# Patient Record
Sex: Female | Born: 1955 | Race: White | Hispanic: No | Smoking: Current every day smoker
Health system: Southern US, Community
[De-identification: ages and names within clinical notes are randomized; demographics above are authoritative.]

## PROBLEM LIST (undated history)

## (undated) DIAGNOSIS — C50919 Malignant neoplasm of unspecified site of unspecified female breast: Secondary | ICD-10-CM

## (undated) DIAGNOSIS — F419 Anxiety disorder, unspecified: Secondary | ICD-10-CM

## (undated) DIAGNOSIS — M779 Enthesopathy, unspecified: Secondary | ICD-10-CM

## (undated) HISTORY — DX: Anxiety disorder, unspecified: F41.9

## (undated) HISTORY — DX: Malignant neoplasm of unspecified site of unspecified female breast: C50.919

## (undated) HISTORY — PX: MASTECTOMY PARTIAL / LUMPECTOMY W/ AXILLARY LYMPHADENECTOMY: SUR852

## (undated) HISTORY — DX: Enthesopathy, unspecified: M77.9

## (undated) HISTORY — PX: MASTECTOMY, RADICAL: SHX710

---

## 1999-05-10 DIAGNOSIS — C50919 Malignant neoplasm of unspecified site of unspecified female breast: Secondary | ICD-10-CM

## 1999-05-10 HISTORY — PX: MASTECTOMY PARTIAL / LUMPECTOMY W/ AXILLARY LYMPHADENECTOMY: SUR852

## 1999-05-10 HISTORY — DX: Malignant neoplasm of unspecified site of unspecified female breast: C50.919

## 2000-06-30 ENCOUNTER — Encounter: Admission: RE | Admit: 2000-06-30 | Discharge: 2000-09-28 | Payer: Self-pay | Admitting: Radiation Oncology

## 2000-07-03 ENCOUNTER — Encounter: Admission: RE | Admit: 2000-07-03 | Discharge: 2000-10-01 | Payer: Self-pay | Admitting: Radiation Oncology

## 2004-05-09 HISTORY — PX: BREAST RECONSTRUCTION: SHX9

## 2004-05-09 HISTORY — PX: MASTECTOMY, RADICAL: SHX710

## 2006-11-03 ENCOUNTER — Emergency Department (HOSPITAL_COMMUNITY)
Admission: EM | Admit: 2006-11-03 | Discharge: 2006-11-03 | Payer: Self-pay | Admitting: Anatomic Pathology & Clinical Pathology

## 2009-05-09 HISTORY — PX: BREAST LUMPECTOMY: SHX2

## 2010-05-30 ENCOUNTER — Encounter: Payer: Self-pay | Admitting: General Surgery

## 2011-08-19 ENCOUNTER — Telehealth (HOSPITAL_COMMUNITY): Payer: Self-pay | Admitting: Oncology

## 2011-09-01 ENCOUNTER — Encounter (HOSPITAL_COMMUNITY): Payer: Self-pay

## 2011-09-01 ENCOUNTER — Encounter (HOSPITAL_COMMUNITY): Payer: BC Managed Care – HMO | Attending: Hematology and Oncology

## 2011-09-01 ENCOUNTER — Encounter (HOSPITAL_COMMUNITY): Payer: BC Managed Care – HMO

## 2011-09-01 VITALS — BP 150/87 | HR 82 | Temp 97.7°F | Ht 67.5 in | Wt 153.0 lb

## 2011-09-01 DIAGNOSIS — R911 Solitary pulmonary nodule: Secondary | ICD-10-CM

## 2011-09-01 DIAGNOSIS — F17200 Nicotine dependence, unspecified, uncomplicated: Secondary | ICD-10-CM | POA: Insufficient documentation

## 2011-09-01 DIAGNOSIS — C44509 Unspecified malignant neoplasm of skin of other part of trunk: Secondary | ICD-10-CM

## 2011-09-01 DIAGNOSIS — C50919 Malignant neoplasm of unspecified site of unspecified female breast: Secondary | ICD-10-CM | POA: Insufficient documentation

## 2011-09-01 DIAGNOSIS — M810 Age-related osteoporosis without current pathological fracture: Secondary | ICD-10-CM

## 2011-09-01 DIAGNOSIS — C44599 Other specified malignant neoplasm of skin of other part of trunk: Secondary | ICD-10-CM

## 2011-09-01 DIAGNOSIS — Z853 Personal history of malignant neoplasm of breast: Secondary | ICD-10-CM

## 2011-09-01 LAB — COMPREHENSIVE METABOLIC PANEL
ALT: 14 U/L (ref 0–35)
AST: 22 U/L (ref 0–37)
Albumin: 4.3 g/dL (ref 3.5–5.2)
CO2: 25 mEq/L (ref 19–32)
Calcium: 10.2 mg/dL (ref 8.4–10.5)
GFR calc non Af Amer: >90 mL/min (ref >90)
Sodium: 134 mEq/L — ABNORMAL LOW (ref 135–145)
Total Protein: 7.5 g/dL (ref 6.0–8.3)

## 2011-09-01 LAB — CBC
MCH: 30 pg (ref 26.0–34.0)
MCHC: 33.3 g/dL (ref 30.0–36.0)
Platelets: 281 10*3/uL (ref 150–400)
RBC: 4.57 MIL/uL (ref 3.87–5.11)

## 2011-09-01 LAB — DIFFERENTIAL
Basophils Relative: 0 % (ref 0–1)
Eosinophils Absolute: 0.4 10*3/uL (ref 0.0–0.7)
Lymphs Abs: 1.8 10*3/uL (ref 0.7–4.0)
Neutro Abs: 5.6 10*3/uL (ref 1.7–7.7)
Neutrophils Relative %: 65 % (ref 43–77)

## 2011-09-01 NOTE — Patient Instructions (Signed)
Patient to follow up as instructed.   Current Outpatient Prescriptions  Medication Sig Dispense Refill  . anastrozole (ARIMIDEX) 1 MG tablet Take 1 mg by mouth daily.      . clonazePAM (KLONOPIN) 0.5 MG tablet Take 0.5 mg by mouth 3 (three) times daily as needed.      . fish oil-omega-3 fatty acids 1000 MG capsule Take 1 g by mouth daily.      . Multiple Vitamins-Minerals (MULTIVITAMIN WITH MINERALS) tablet Take 1 tablet by mouth daily.      . ranitidine (ZANTAC) 150 MG tablet Take 150 mg by mouth 2 (two) times daily.      . sertraline (ZOLOFT) 100 MG tablet Take 100 mg by mouth daily.      . vitamin B-12 (CYANOCOBALAMIN) 100 MCG tablet Take 100 mcg by mouth daily.      . vitamin C (ASCORBIC ACID) 500 MG tablet Take 500 mg by mouth daily.            April 2013  Sunday Monday Tuesday Wednesday Thursday Friday Saturday      1   2   3   4   5   6    7   8   9   10   11   12   13    14   15   16   17   18   19   20    21   22   23   24   25    NEW PATIENT 60  11:00 AM  (60 min.)  [facility] Covering Provider  [facility] 26   27    28   29    30

## 2011-09-01 NOTE — Progress Notes (Signed)
CC:   Krista Lora, MD Krista Neas, DO Krista [place], M.D.  IDENTIFYING STATEMENT:  The patient is a 56 year old woman seen at the request of Dr. Nancie [place] with breast cancer.  HISTORY OF PRESENT ILLNESS:  The patient was referred to [facility] to establish continuity of care.  She has a history of recurrent breast cancer and is currently on hormonal therapy in the form of Arimidex which she began on 02/26/2010.  The patient's history is as follows:  She was initially diagnosed following a screening mammogram in [facility] in [place] 2001 with a left breast mass.  Subsequent workup and biopsy diagnosed invasive adenocarcinoma.  On May 11, 2000, she underwent a partial left lumpectomy and the tumor was 0.88 cm in size, ER positive 80%, PR positive 10%, and HER2/neu negative with a pathology stage pT1b N0 MX.  The patient then completed radiation therapy in [place] under the care of Dr. Emilie Rutter.  She was apparently offered chemotherapy and hormonal therapy which she declined.  In July 2006, she had a recurrence in the left breast at the surgical site.  She underwent bilateral mastectomies for an 8 mm invasive adenocarcinoma, ER 95%, PR 20%, and HER2/neu negative.  The pathological stage was a pt1b NX MX. The patient was offered and declined tamoxifen.  She underwent a TRAM reconstruction in July 2007.  She continued surveillance and did well until December 28, 2009.  She noted nodularity on the chest wall which was thought initially to be a fat necrosis but subsequent workup that included a biopsy showed an invasive ductal adenocarcinoma.  As a result of positive margins, she underwent a re-excision on 02/01/2010.  The patient agreed to hormonal therapy and began Arimidex on 02/27/2011.  Of some note, a CT scan had noted small pulmonary nodules which have been followed with annual CT scans.  Her last CT scan on 03/04/2011 showed no definite CT evidence  of metastatic disease to the chest. There was an unchanged 5 mm nodule and other 3 mm subpleural nodule densities that remained stable.  In addition, she had received a DEXA scan a year ago for follow up of osteoporosis.  She takes Tums and OTC vitamin D.  She states she has not lost any weight.  She denies chest pain, cough, shortness of breath.  She is tolerating Arimidex with very minimal difficulty without nausea, vomiting, or diarrhea.  PAST MEDICAL HISTORY:  Recurrent breast cancer.  MEDICATIONS:  Arimidex 1 mg daily, Klonopin 0.5 mg 3 times daily as needed, fish oil1000 mg 1 g daily, multivitamin 1 tablet daily, Zantac 150 mg 2 tablets daily, Zoloft 100 mg daily, vitamin B12 100 mcg daily, vitamin C 500 mg daily.  ALLERGIES:  Codeine and latex.  SOCIAL HISTORY:  The patient is married with 2 stepchildren.  Smokes 1 pack a day for 30 years.  Drinks light beer a day.  She is a Futures trader. Lives in the [place] area.  FAMILY HISTORY:  The patient's mother had breast cancer and father had lung cancer.  REVIEW OF SYSTEMS:  Denies fever, chills, night sweats, anorexia, weight loss.  GI:  Denies nausea, vomiting, abdominal pain, diarrhea, melena, hematochezia.  GU:  Dysuria, hematuria, nocturia, frequency. Cardiovascular:  Denies chest pain, PND, orthopnea, ankle swelling. Skin:  No bruising or rash.  Rest of review of systems negative.  PHYSICAL EXAM:  General:  Patient is alert and oriented x3.  Vitals: Pulse 82, blood pressure 150/87, temperature 97.7, respirations 16, weight  153 pounds.  HEENT:  Head is atraumatic, normocephalic.  Sclerae anicteric.  Pupils equal, round, and reactive to light.  Mouth moist without ulcerations, thrush, or lesions.  Neck:  Supple.  Chest:  Clear to percussion and auscultation.  CVS:  First and second heart sounds present.  No added sounds or murmurs.  Abdomen:  Soft, nontender.  Bowel sounds present.  Extremities:  No calf tenderness or edema.   Lymph Nodes:  No adenopathy.  Breast Exam:  Bilateral breast implants with no masses or skin lesion.  No changes on chest wall or rash.  IMPRESSION AND PLAN:  [name] is a 56 year old woman with a history of recurrent left breast cancer status post lumpectomy initially followed by bilateral mastectomies and re-excision for recurrence.  She has received systemic chemotherapy and hormonal therapy in the past, but did agreed to Arimidex beginning 02/26/2010.  She is tolerating this well. Lung nodules had been noted on initial CT scans which remain unchanged and has been recommended to continue to monitor.  She is having this monitored annually which is reasonable. She continues to abuse tobacco and does not want to cease at this time. We offered smoking cessation.  She declined.  She has a history of osteoporosis and will need a bone density test at her next followup visit in 6 months' time with a CT scan.  She will continue Arimidex.  I spent more than half the time coordinating care.    ______________________________ Laurice Record, M.D. LIO/MEDQ  D:  09/01/2011  T:  09/01/2011  Job:  [id number]

## 2011-09-01 NOTE — Progress Notes (Signed)
This office note has been dictated.

## 2011-09-02 LAB — VITAMIN D 25 HYDROXY (VIT D DEFICIENCY, FRACTURES): Vit D, 25-Hydroxy: 65 ng/mL (ref 30–89)

## 2011-09-05 ENCOUNTER — Encounter (HOSPITAL_COMMUNITY): Payer: BC Managed Care – HMO

## 2011-09-12 NOTE — Progress Notes (Signed)
Wrong chart/wrong account number. This activity is VOID.

## 2011-09-25 NOTE — Progress Notes (Signed)
Note appreciated

## 2012-02-28 ENCOUNTER — Other Ambulatory Visit (HOSPITAL_COMMUNITY): Payer: BC Managed Care – HMO

## 2012-02-29 ENCOUNTER — Other Ambulatory Visit (HOSPITAL_COMMUNITY): Payer: Self-pay

## 2012-02-29 ENCOUNTER — Other Ambulatory Visit (HOSPITAL_COMMUNITY): Payer: BC Managed Care – HMO

## 2012-02-29 ENCOUNTER — Encounter (HOSPITAL_COMMUNITY): Payer: BC Managed Care – HMO | Attending: Oncology

## 2012-02-29 DIAGNOSIS — C44599 Other specified malignant neoplasm of skin of other part of trunk: Secondary | ICD-10-CM

## 2012-02-29 DIAGNOSIS — M810 Age-related osteoporosis without current pathological fracture: Secondary | ICD-10-CM | POA: Insufficient documentation

## 2012-02-29 DIAGNOSIS — C50919 Malignant neoplasm of unspecified site of unspecified female breast: Secondary | ICD-10-CM

## 2012-02-29 LAB — COMPREHENSIVE METABOLIC PANEL
ALT: 12 U/L (ref 0–35)
AST: 19 U/L (ref 0–37)
Calcium: 9.5 mg/dL (ref 8.4–10.5)
Potassium: 4.2 mEq/L (ref 3.5–5.1)
Sodium: 134 mEq/L — ABNORMAL LOW (ref 135–145)
Total Protein: 7.1 g/dL (ref 6.0–8.3)

## 2012-02-29 MED ORDER — ANASTROZOLE 1 MG PO TABS: 1.0000 mg | ORAL_TABLET | Freq: Every day | ORAL | Status: DC

## 2012-02-29 NOTE — Progress Notes (Signed)
Labs drawn today for [id number],cmp

## 2012-03-01 ENCOUNTER — Ambulatory Visit (HOSPITAL_COMMUNITY)
Admission: RE | Admit: 2012-03-01 | Discharge: 2012-03-01 | Disposition: A | Discharge status: Home / Self Care | Payer: BC Managed Care – PPO | Source: Ambulatory Visit | Attending: Hematology and Oncology | Admitting: Hematology and Oncology

## 2012-03-01 DIAGNOSIS — K429 Umbilical hernia without obstruction or gangrene: Secondary | ICD-10-CM | POA: Insufficient documentation

## 2012-03-01 DIAGNOSIS — K8020 Calculus of gallbladder without cholecystitis without obstruction: Secondary | ICD-10-CM | POA: Insufficient documentation

## 2012-03-01 DIAGNOSIS — Z9010 Acquired absence of unspecified breast and nipple: Secondary | ICD-10-CM | POA: Insufficient documentation

## 2012-03-01 DIAGNOSIS — Z853 Personal history of malignant neoplasm of breast: Secondary | ICD-10-CM | POA: Insufficient documentation

## 2012-03-01 DIAGNOSIS — R918 Other nonspecific abnormal finding of lung field: Secondary | ICD-10-CM | POA: Insufficient documentation

## 2012-03-01 DIAGNOSIS — C50919 Malignant neoplasm of unspecified site of unspecified female breast: Secondary | ICD-10-CM

## 2012-03-01 LAB — CANCER ANTIGEN 27.29: CA 27.29: 12 U/mL (ref 0–39)

## 2012-03-01 MED ORDER — IOHEXOL 300 MG/ML  SOLN
100.0000 mL | Freq: Once | INTRAMUSCULAR | Status: AC | PRN
  Administered 2012-03-01: 100 mL via INTRAVENOUS

## 2012-03-02 ENCOUNTER — Encounter (HOSPITAL_COMMUNITY): Payer: Self-pay | Admitting: Oncology

## 2012-03-02 ENCOUNTER — Other Ambulatory Visit (HOSPITAL_COMMUNITY): Payer: Self-pay

## 2012-03-02 ENCOUNTER — Other Ambulatory Visit (HOSPITAL_COMMUNITY): Payer: Self-pay | Admitting: Oncology

## 2012-03-05 ENCOUNTER — Ambulatory Visit (HOSPITAL_COMMUNITY): Payer: BC Managed Care – HMO | Admitting: Oncology

## 2012-03-05 ENCOUNTER — Telehealth (HOSPITAL_COMMUNITY): Payer: Self-pay | Admitting: *Deleted

## 2012-03-05 NOTE — Telephone Encounter (Signed)
Pt's husband called requesting info on ct scan that his wife had. Please advise

## 2012-03-06 ENCOUNTER — Ambulatory Visit (HOSPITAL_COMMUNITY): Payer: BC Managed Care – HMO | Admitting: Oncology

## 2012-03-09 ENCOUNTER — Other Ambulatory Visit (HOSPITAL_COMMUNITY): Payer: Self-pay | Admitting: *Deleted

## 2012-03-09 DIAGNOSIS — C50919 Malignant neoplasm of unspecified site of unspecified female breast: Secondary | ICD-10-CM

## 2012-03-09 DIAGNOSIS — R918 Other nonspecific abnormal finding of lung field: Secondary | ICD-10-CM

## 2012-03-12 ENCOUNTER — Encounter (HOSPITAL_COMMUNITY): Payer: BC Managed Care – HMO | Attending: Oncology | Admitting: Oncology

## 2012-03-12 VITALS — BP 140/85 | HR 78 | Temp 97.6°F | Resp 16 | Ht 67.0 in | Wt 151.2 lb

## 2012-03-12 DIAGNOSIS — K8020 Calculus of gallbladder without cholecystitis without obstruction: Secondary | ICD-10-CM

## 2012-03-12 DIAGNOSIS — M25519 Pain in unspecified shoulder: Secondary | ICD-10-CM

## 2012-03-12 DIAGNOSIS — C50919 Malignant neoplasm of unspecified site of unspecified female breast: Secondary | ICD-10-CM | POA: Insufficient documentation

## 2012-03-12 NOTE — Progress Notes (Signed)
Problem #1 recurrent cancer to left chest wall. She was initially diagnosed with a T1 B. breast cancer in 2001 status post lumpectomy followed by radiation therapy. She then had a recurrence to the left breast 2006 status post bilateral mastectomies at that time. She declined hormonal intervention at that juncture. She then had recurrent disease the left chest wall in 2011 status post biopsy proven disease followed by the use of anastrozole which she still takes today. She tolerates the anastrozole extremely well without obvious side effects. Her cancer was said to be estrogen receptor +80%, PR +10% at presentation with her recurrence in 2006 your receptors 95%, PR receptors 20%, HER-2/neu nonamplified. She underwent TRAM reconstruction in July 2007. I do not have receptor studies from the 2011 recurrence. She has never had tamoxifen or chemotherapy. Problem #2 ongoing smoking 28 pack years. Problem #3 possible dry eye syndrome Problem #4 anxiety and depression on Klonopin when necessary as well as Zoloft 100 mg a day Problem #5 single small gallstone asymptomatic  This 56 year old woman is here today for followup. She CAT scans the other day which do not reveal any evidence for metastatic disease to chest abdomen or pelvis. She has a single small gallstone. She occasionally shoulder discomfort and right upper quadrant discomfort but I'm not sure we can blame it on this very tiny gallstone. She may have some mild arthritis in her shoulders.  She is married for the second time. She was never pregnant and has no children whatsoever. She has 3 sisters who have no children. Her mother died at age 59 of metastatic recurrent breast cancer. She does not know of any other family history. She herself was never tested for the gene abnormality that she is aware of. She drinks beer on occasion. Oncologic review of systems is otherwise noncontributory. I believe she uses a vitamin a cream for some skin issues which I  have referred her to her dermatologist for a refill. Her vital signs are recorded and are unremarkable. She has no palpable lymphadenopathy in the cervical, supraclavicular, infraclavicular, axillary or inguinal areas. She does not have arm or leg edema. Abdomen is soft nontender without hepatosplenomegaly. Bowel sounds are diminished. She does state she has constipation at times. Heart exam shows a regular rhythm and rate without murmur rub or gallop. Lungs are clear to auscultation and percussion. Both chest walls are clear of lesions. She has reconstructed areas on both breasts areas. They are asymmetrical with the left more prominent than the right. She has tenderness over the left more so than the right. She is alert and oriented facial symmetry appears intact.  She may need a bone density in the next 12 months but she states she had one at [facility] within the last year and a half. We'll try to get those records.  She is to continue the anastrozole. We will see her in 12 weeks with blood work and a physical exam. We will then do CAT scans on her in 6 months.

## 2012-03-12 NOTE — Patient Instructions (Addendum)
[facility]  Discharge Instructions  RECOMMENDATIONS MADE BY THE CONSULTANT AND ANY TEST RESULTS WILL BE SENT TO YOUR REFERRING DOCTOR.   Continue taking the the Arimidex as prescribed.  We will have you come back in 12 weeks for lab work and then a few days later to see the doctor.  We will plan on repeating your scans again in 6 months.    I acknowledge that I have been informed and understand all the instructions given to me and received a copy. I do not have any more questions at this time, but understand that I may call the Specialty Clinic at [facility] at [phone] during business hours should I have any further questions or need assistance in obtaining follow-up care.    __________________________________________  _____________  __________ Signature of Patient or Authorized Representative            Date                   Time    __________________________________________ Nurse's Signature

## 2012-06-18 ENCOUNTER — Encounter (HOSPITAL_COMMUNITY): Payer: BC Managed Care – HMO | Attending: Oncology

## 2012-06-18 DIAGNOSIS — C50919 Malignant neoplasm of unspecified site of unspecified female breast: Secondary | ICD-10-CM

## 2012-06-18 LAB — CBC WITH DIFFERENTIAL/PLATELET
Basophils Relative: 1 % (ref 0–1)
Eosinophils Absolute: 0.2 10*3/uL (ref 0.0–0.7)
Eosinophils Relative: 3 % (ref 0–5)
MCH: 30.8 pg (ref 26.0–34.0)
MCHC: 34.7 g/dL (ref 30.0–36.0)
MCV: 88.8 fL (ref 78.0–100.0)
Neutrophils Relative %: 62 % (ref 43–77)
Platelets: 255 10*3/uL (ref 150–400)

## 2012-06-18 LAB — COMPREHENSIVE METABOLIC PANEL
ALT: 14 U/L (ref 0–35)
AST: 20 U/L (ref 0–37)
Albumin: 4.2 g/dL (ref 3.5–5.2)
Alkaline Phosphatase: 73 U/L (ref 39–117)
CO2: 26 mEq/L (ref 19–32)
Chloride: 99 mEq/L (ref 96–112)
GFR calc non Af Amer: 75 mL/min — ABNORMAL LOW (ref >90)
Potassium: 4.2 mEq/L (ref 3.5–5.1)
Sodium: 135 mEq/L (ref 135–145)
Total Bilirubin: 0.3 mg/dL (ref 0.3–1.2)

## 2012-06-18 LAB — CANCER ANTIGEN 27.29: CA 27.29: 16 U/mL (ref 0–39)

## 2012-06-18 NOTE — Progress Notes (Signed)
Krista Moore's reason for visit today are for labs as scheduled per MD orders.  Venipuncture performed with a 23 gauge butterfly needle to R Antecubital.  Krista [place] tolerated venipuncture well and without incident; questions were answered and patient was discharged.

## 2012-06-20 ENCOUNTER — Ambulatory Visit (HOSPITAL_COMMUNITY): Payer: BC Managed Care – HMO | Admitting: Oncology

## 2012-06-22 ENCOUNTER — Ambulatory Visit (HOSPITAL_COMMUNITY): Payer: BC Managed Care – HMO | Admitting: Oncology

## 2012-07-11 ENCOUNTER — Encounter (HOSPITAL_COMMUNITY): Payer: Self-pay | Admitting: Emergency Medicine

## 2012-07-11 ENCOUNTER — Emergency Department (HOSPITAL_COMMUNITY): Payer: BC Managed Care – PPO

## 2012-07-11 ENCOUNTER — Emergency Department (HOSPITAL_COMMUNITY)
Admission: EM | Admit: 2012-07-11 | Discharge: 2012-07-12 | Disposition: A | Discharge status: Home / Self Care | Payer: BC Managed Care – PPO | Attending: Emergency Medicine | Admitting: Emergency Medicine

## 2012-07-11 DIAGNOSIS — R002 Palpitations: Secondary | ICD-10-CM | POA: Insufficient documentation

## 2012-07-11 DIAGNOSIS — R110 Nausea: Secondary | ICD-10-CM | POA: Insufficient documentation

## 2012-07-11 DIAGNOSIS — Z8709 Personal history of other diseases of the respiratory system: Secondary | ICD-10-CM | POA: Insufficient documentation

## 2012-07-11 DIAGNOSIS — Z720 Tobacco use: Secondary | ICD-10-CM

## 2012-07-11 DIAGNOSIS — I493 Ventricular premature depolarization: Secondary | ICD-10-CM

## 2012-07-11 DIAGNOSIS — F411 Generalized anxiety disorder: Secondary | ICD-10-CM | POA: Insufficient documentation

## 2012-07-11 DIAGNOSIS — Z79899 Other long term (current) drug therapy: Secondary | ICD-10-CM | POA: Insufficient documentation

## 2012-07-11 DIAGNOSIS — R0602 Shortness of breath: Secondary | ICD-10-CM | POA: Insufficient documentation

## 2012-07-11 DIAGNOSIS — R05 Cough: Secondary | ICD-10-CM | POA: Insufficient documentation

## 2012-07-11 DIAGNOSIS — I4949 Other premature depolarization: Secondary | ICD-10-CM | POA: Insufficient documentation

## 2012-07-11 DIAGNOSIS — F17200 Nicotine dependence, unspecified, uncomplicated: Secondary | ICD-10-CM | POA: Insufficient documentation

## 2012-07-11 DIAGNOSIS — R059 Cough, unspecified: Secondary | ICD-10-CM | POA: Insufficient documentation

## 2012-07-11 DIAGNOSIS — R630 Anorexia: Secondary | ICD-10-CM | POA: Insufficient documentation

## 2012-07-11 DIAGNOSIS — Z853 Personal history of malignant neoplasm of breast: Secondary | ICD-10-CM | POA: Insufficient documentation

## 2012-07-11 LAB — D-DIMER, QUANTITATIVE: D-Dimer, Quant: <0.27 ug/mL-FEU (ref 0.00–0.48)

## 2012-07-11 LAB — TROPONIN I: Troponin I: <0.3 ng/mL (ref <0.30)

## 2012-07-11 LAB — BASIC METABOLIC PANEL
CO2: 21 mEq/L (ref 19–32)
Calcium: 9.5 mg/dL (ref 8.4–10.5)
Chloride: 90 mEq/L — ABNORMAL LOW (ref 96–112)
Creatinine, Ser: 0.75 mg/dL (ref 0.50–1.10)
GFR calc Af Amer: >90 mL/min (ref >90)
Sodium: 128 mEq/L — ABNORMAL LOW (ref 135–145)

## 2012-07-11 LAB — CBC
MCH: 31.4 pg (ref 26.0–34.0)
MCV: 86.7 fL (ref 78.0–100.0)
Platelets: 239 10*3/uL (ref 150–400)
RBC: 4.75 MIL/uL (ref 3.87–5.11)
RDW: 13 % (ref 11.5–15.5)
WBC: 10.9 10*3/uL — ABNORMAL HIGH (ref 4.0–10.5)

## 2012-07-11 MED ORDER — ASPIRIN 325 MG PO TABS
325.0000 mg | ORAL_TABLET | ORAL | Status: AC
  Administered 2012-07-11: 325 mg via ORAL
  Filled 2012-07-11: qty 1

## 2012-07-11 NOTE — ED Notes (Signed)
Clarification: EKG was done during initial triage @ 2116.

## 2012-07-11 NOTE — ED Provider Notes (Signed)
History    This chart was scribed for [name], MD by Charolett Bumpers, ED Scribe. The patient was seen in room APA04/APA04. Patient's care was started at 2136.   CSN: [id number]  Arrival date & time 07/11/12  2114   First MD Initiated Contact with Patient 07/11/12 2136      Chief Complaint  Patient presents with  . Chest Pain  . Palpitations    The history is provided by the patient. No language interpreter was used.   Krista Moore is a 57 y.o. female who presents to the Emergency Department complaining of sudden onset of mild  chest pain that lasted for a minute. She reports that she has had palpitations, described as her heart "rolling over in her chest", all day today prior to the chest pain. She also reports associated SOB, nausea and a productive cough with green phlegm. She states that she has not eaten anything today due to a decreased appetite. She states that she felt faint but denies any syncope. She is a smoker and has a h/o bronchitis. She reports a h/o palpitations prior to menopause. She denies any h/o asthma. She has a h/o recurrent breast CA and has had a lumpectomy, double mastectomy, radiation and chemotherapy. She denies any h/o HTN, DM.   Past Medical History  Diagnosis Date  . Breast cancer     Recurrent  . Anxiety     Past Surgical History  Procedure Laterality Date  . Mastectomy partial / lumpectomy w/ axillary lymphadenectomy  2001 left breast  . Mastectomy, radical  2006 right and left breast  . Breast reconstruction  2006  . Breast lumpectomy  2011    Family History  Problem Relation Age of Onset  . Cancer Mother     breast cancer  . Diabetes Father     History  Substance Use Topics  . Smoking status: Current Every Day Smoker -- 1.00 packs/day for 30 years  . Smokeless tobacco: Never Used  . Alcohol Use: Yes    OB History   Grav Para Term Preterm Abortions TAB SAB Ect Mult Living                  Review of Systems A  complete 10 system review of systems was obtained and all systems are negative except as noted in the HPI and PMH.   Allergies  Codeine; Latex; and Morphine and related  Home Medications   Current Outpatient Rx  Name  Route  Sig  Dispense  Refill  . anastrozole (ARIMIDEX) 1 MG tablet   Oral   Take 1 mg by mouth every evening.         . Carboxymethylcellulose Sodium (THERATEARS) 0.25 % SOLN   Ophthalmic   Apply 1 drop to eye every 4 (four) hours.         . clonazePAM (KLONOPIN) 0.5 MG tablet   Oral   Take 0.5 mg by mouth 3 (three) times daily as needed for anxiety.          . fish oil-omega-3 fatty acids 1000 MG capsule   Oral   Take 1 g by mouth daily.         . Multiple Vitamins-Minerals (MULTIVITAMIN WITH MINERALS) tablet   Oral   Take 1 tablet by mouth daily.         . ranitidine (ZANTAC) 150 MG tablet   Oral   Take 150 mg by mouth 2 (two) times daily.         Marland Kitchen  sertraline (ZOLOFT) 100 MG tablet   Oral   Take 100 mg by mouth every evening.          . vitamin B-12 (CYANOCOBALAMIN) 100 MCG tablet   Oral   Take 100 mcg by mouth daily.         . vitamin C (ASCORBIC ACID) 500 MG tablet   Oral   Take 500 mg by mouth daily.           BP 153/83  Pulse 76  Resp 18  SpO2 100%  Physical Exam  Nursing note and vitals reviewed. Constitutional: She is oriented to person, place, and time. She appears well-developed and well-nourished.  HENT:  Head: Normocephalic and atraumatic.  Right Ear: External ear normal.  Left Ear: External ear normal.  Mouth/Throat: Oropharynx is clear and moist. No oropharyngeal exudate.  Eyes: Conjunctivae and EOM are normal. Pupils are equal, round, and reactive to light.  Neck: Normal range of motion and phonation normal. Neck supple.  Cardiovascular: Normal rate, regular rhythm, normal heart sounds and intact distal pulses.   No murmur heard. Pulmonary/Chest: Effort normal and breath sounds normal. She exhibits  tenderness. She exhibits no bony tenderness.  Chest wall is diffusely tender, pt notes that she always has some discomfort.   Abdominal: Soft. Normal appearance. There is no tenderness.  Musculoskeletal: Normal range of motion. She exhibits tenderness.  Mild tenderness to the anterior right ankle and leg.   Neurological: She is alert and oriented to person, place, and time. She has normal strength. No cranial nerve deficit or sensory deficit. She exhibits normal muscle tone. Coordination normal.  Skin: Skin is warm, dry and intact.  Psychiatric: She has a normal mood and affect. Her behavior is normal. Judgment and thought content normal.    ED Course  Procedures (including critical care time)  DIAGNOSTIC STUDIES: Oxygen Saturation is 100% on room air, normal by my interpretation.    COORDINATION OF CARE:  21:45-Medication Orders: Aspirin tablet 325 mg-once.   22:00-Discussed planned course of treatment with the pt, including a chest x-ray and blood work, who is agreeable at this time.     Date: 02/24/2012  Rate: 87  Rhythm: normal sinus rhythm, premature atrial contractions (PAC) and premature ventricular contractions (PVC)  QRS Axis: left  PR and QT Intervals: normal  ST/T Wave abnormalities: normal  PR and QRS Conduction Disutrbances:none  Narrative Interpretation:   Old EKG Reviewed: none available   Labs Reviewed  CBC - Abnormal; Notable for the following:    WBC 10.9 (*)    MCHC 36.2 (*)    All other components within normal limits  BASIC METABOLIC PANEL - Abnormal; Notable for the following:    Sodium 128 (*)    Potassium 3.4 (*)    Chloride 90 (*)    Glucose, Bld 123 (*)    All other components within normal limits  TROPONIN I  D-DIMER, QUANTITATIVE   Dg Chest Port 1 View  07/11/2012  *RADIOLOGY REPORT*  Clinical Data: Chest pain and palpitations.  PORTABLE CHEST - 1 VIEW  Comparison: CT of the chest performed 03/01/2012  Findings: The lungs are well expanded.   Vague densities overlying the lower lung zones, more prominent on the left, are thought to reflect the patient's bilateral breast implants.  No definite focal consolidation, pleural effusion or pneumothorax is seen.  The cardiomediastinal silhouette is normal in appearance.  No acute osseous abnormalities are seen.  Scattered clips are seen at the left axilla.  IMPRESSION: No acute cardiopulmonary process identified.   Original Report Authenticated By: Tonia Ghent, M.D.    Nursing notes, applicable records and vitals reviewed.  Radiologic Images/Reports reviewed.   1. Palpitations   2. PVC's (premature ventricular contractions)       MDM  Palpitations, related to PVCs. Doubt ACS, PE, pneumonia. Doubt metabolic instability, serious bacterial infection or impending vascular collapse; the patient is stable for discharge.    I personally performed the services described in this documentation, which was scribed in my presence. The recorded information has been reviewed and is accurate.     Plan: Home Medications- usual; Home Treatments- rest, avoid caffeine, stop smoking; Recommended follow up- PCP, 3 days      [name], MD 07/12/12 0005

## 2012-07-11 NOTE — ED Notes (Signed)
Pt c/o chest pain and palpitations all day.

## 2012-07-11 NOTE — ED Notes (Signed)
Trial ambulation done, pt tolerated well with no exacerbation of symptoms.

## 2012-09-07 ENCOUNTER — Ambulatory Visit (HOSPITAL_COMMUNITY)
Admission: RE | Admit: 2012-09-07 | Discharge: 2012-09-07 | Disposition: A | Discharge status: Home / Self Care | Payer: BC Managed Care – PPO | Source: Ambulatory Visit | Attending: Oncology | Admitting: Oncology

## 2012-09-07 DIAGNOSIS — R918 Other nonspecific abnormal finding of lung field: Secondary | ICD-10-CM | POA: Insufficient documentation

## 2012-09-07 DIAGNOSIS — C50919 Malignant neoplasm of unspecified site of unspecified female breast: Secondary | ICD-10-CM

## 2012-09-07 DIAGNOSIS — Z853 Personal history of malignant neoplasm of breast: Secondary | ICD-10-CM | POA: Insufficient documentation

## 2012-09-07 MED ORDER — IOHEXOL 300 MG/ML  SOLN
80.0000 mL | Freq: Once | INTRAMUSCULAR | Status: AC | PRN
  Administered 2012-09-07: 80 mL via INTRAVENOUS

## 2012-12-05 ENCOUNTER — Encounter (HOSPITAL_COMMUNITY): Payer: Self-pay | Admitting: Emergency Medicine

## 2013-02-21 ENCOUNTER — Encounter (HOSPITAL_COMMUNITY): Payer: Self-pay

## 2013-02-21 ENCOUNTER — Encounter (HOSPITAL_COMMUNITY): Payer: BC Managed Care – PPO | Attending: Hematology and Oncology

## 2013-02-21 VITALS — BP 134/83 | HR 81 | Temp 98.0°F | Resp 18 | Wt 156.6 lb

## 2013-02-21 DIAGNOSIS — R918 Other nonspecific abnormal finding of lung field: Secondary | ICD-10-CM

## 2013-02-21 DIAGNOSIS — Z853 Personal history of malignant neoplasm of breast: Secondary | ICD-10-CM | POA: Insufficient documentation

## 2013-02-21 DIAGNOSIS — Z09 Encounter for follow-up examination after completed treatment for conditions other than malignant neoplasm: Secondary | ICD-10-CM | POA: Insufficient documentation

## 2013-02-21 DIAGNOSIS — C50912 Malignant neoplasm of unspecified site of left female breast: Secondary | ICD-10-CM

## 2013-02-21 DIAGNOSIS — C50919 Malignant neoplasm of unspecified site of unspecified female breast: Secondary | ICD-10-CM

## 2013-02-21 LAB — COMPREHENSIVE METABOLIC PANEL
ALT: 12 U/L (ref 0–35)
AST: 21 U/L (ref 0–37)
Albumin: 4.2 g/dL (ref 3.5–5.2)
Alkaline Phosphatase: 74 U/L (ref 39–117)
BUN: 10 mg/dL (ref 6–23)
Chloride: 96 mEq/L (ref 96–112)
Potassium: 4 mEq/L (ref 3.5–5.1)
Sodium: 134 mEq/L — ABNORMAL LOW (ref 135–145)
Total Bilirubin: 0.3 mg/dL (ref 0.3–1.2)
Total Protein: 7.3 g/dL (ref 6.0–8.3)

## 2013-02-21 LAB — CBC WITH DIFFERENTIAL/PLATELET
Basophils Absolute: 0 10*3/uL (ref 0.0–0.1)
Basophils Relative: 0 % (ref 0–1)
Eosinophils Absolute: 0.2 10*3/uL (ref 0.0–0.7)
Hemoglobin: 13.5 g/dL (ref 12.0–15.0)
MCH: 30.8 pg (ref 26.0–34.0)
MCHC: 34.5 g/dL (ref 30.0–36.0)
Monocytes Relative: 12 % (ref 3–12)
Neutro Abs: 5.4 10*3/uL (ref 1.7–7.7)
Neutrophils Relative %: 67 % (ref 43–77)
Platelets: 263 10*3/uL (ref 150–400)

## 2013-02-21 MED ORDER — FLUTICASONE PROPIONATE 50 MCG/ACT NA SUSP
2.0000 | Freq: Every day | NASAL | Status: AC
Start: 2013-02-21 — End: ?

## 2013-02-21 MED ORDER — FEXOFENADINE-PSEUDOEPHED ER 180-240 MG PO TB24: 1.0000 | ORAL_TABLET | Freq: Every day | ORAL | Status: AC

## 2013-02-21 NOTE — Patient Instructions (Signed)
[facility] Discharge Instructions  RECOMMENDATIONS MADE BY THE CONSULTANT AND ANY TEST RESULTS WILL BE SENT TO YOUR REFERRING PHYSICIAN.  EXAM FINDINGS BY THE PHYSICIAN TODAY AND SIGNS OR SYMPTOMS TO REPORT TO CLINIC OR PRIMARY PHYSICIAN: Exam and findings as discussed by Dr. Zigmund Daniel.  Talk with your eye doctor about using Restasis eye drops for your dry eyes.  Blood work today and in 3 months.  If there are any issues we will call your.  MEDICATIONS PRESCRIBED:  Use Claritin D or Allegra D for your nasal congestions and ear problems.  INSTRUCTIONS/FOLLOW-UP: 3 months with labs and MD appointment.  Thank you for choosing [facility] to provide your oncology and hematology care.  To afford each patient quality time with our providers, please arrive at least 15 minutes before your scheduled appointment time.  With your help, our goal is to use those 15 minutes to complete the necessary work-up to ensure our physicians have the information they need to help with your evaluation and healthcare recommendations.    Effective January 1st, 2014, we ask that you re-schedule your appointment with our physicians should you arrive 10 or more minutes late for your appointment.  We strive to give you quality time with our providers, and arriving late affects you and other patients whose appointments are after yours.    Again, thank you for choosing [facility].  Our hope is that these requests will decrease the amount of time that you wait before being seen by our physicians.       _____________________________________________________________  Should you have questions after your visit to [facility], please contact our office at [phone] between the hours of 8:30 a.m. and 5:00 p.m.  Voicemails left after 4:30 p.m. will not be returned until the following business day.  For prescription refill requests, have your pharmacy contact our office  with your prescription refill request.

## 2013-02-21 NOTE — Progress Notes (Signed)
[facility] OFFICE PROGRESS NOTE  Louie Boston, MD [address], [place] [address]  DIAGNOSIS: Breast cancer, left - Plan: CBC with Differential, Comprehensive metabolic panel, CEA, Cancer antigen 27.29  Pulmonary nodules - Plan: CEA, Cancer antigen 27.29  Chief Complaint  Patient presents with  . Breast Cancer    CURRENT THERAPY: Anastrozole 1 mg daily  INTERVAL HISTORY: Krista Moore 57 y.o. female returns for followup of recurrent breast cancer, currently taking anastrozole 1 mg daily. Primary problem today is a stuffy left ear as well as cough without expectoration. Patient is taking Allegra. She reason a sore ophthalmologist because of frequent bleeding and a diagnosis of dry eye syndrome was made. No specific therapy was recommended other than artificial tears. Appetite has been good with no sore throat, fever, night sweats, abdominal pain, vaginal dryness, significant muscle or joint discomfort or swelling, lower extremity swelling or redness, lymphedema, PND, orthopnea, palpitations, headache, or seizures.  MEDICAL HISTORY: Past Medical History  Diagnosis Date  . Breast cancer 2001    left side  . Breast cancer     Recurrent  . Anxiety     INTERIM HISTORY: has Breast cancer on her problem list.   Initially diagnosed with a T1 B. breast cancer in 2001 status post lumpectomy followed by radiation therapy. She then had a recurrence to the left breast 2006 status post bilateral mastectomies at that time. She declined hormonal intervention at that juncture. She then had recurrent disease the left chest wall in 2011 status post biopsy proven disease followed by the use of anastrozole which she still takes today. She tolerates the anastrozole extremely well without obvious side effects. Her cancer was said to be estrogen receptor +80%, PR +10% at presentation with her recurrence in 2006 your receptors 95%, PR receptors 20%, HER-2/neu nonamplified. She  underwent TRAM reconstruction in July 2007. I do not have receptor studies from the 2011 recurrence. She has never had tamoxifen or chemotherapy  ALLERGIES:  is allergic to codeine; codeine; latex; morphine and related; and morphine and related.  MEDICATIONS: has a current medication list which includes the following prescription(s): anastrozole, carboxymethylcellulose sodium, clonazepam, fish oil-omega-3 fatty acids, multivitamin with minerals, ranitidine, sertraline, vitamin b-12, and vitamin c.  SURGICAL HISTORY:  Past Surgical History  Procedure Laterality Date  . Mastectomy partial / lumpectomy w/ axillary lymphadenectomy  2001    left  . Mastectomy, radical  2006    right and left  . Breast lumpectomy  2011    left breast  . Mastectomy partial / lumpectomy w/ axillary lymphadenectomy  2001 left breast  . Mastectomy, radical  2006 right and left breast  . Breast reconstruction  2006  . Breast lumpectomy  2011    FAMILY HISTORY: family history includes Cancer in her mother; Diabetes in her father.  SOCIAL HISTORY:  reports that she has been smoking Cigarettes.  She has a 30 pack-year smoking history. She has never used smokeless tobacco. She reports that she drinks alcohol. She reports that she does not use illicit drugs.  REVIEW OF SYSTEMS:  Other than that discussed above is noncontributory.  PHYSICAL EXAMINATION: ECOG PERFORMANCE STATUS: 0 - Asymptomatic  Blood pressure 134/83, pulse 81, temperature 98 F (36.7 C), temperature source Oral, resp. rate 18, weight 156 lb 9.6 oz (71.033 kg).  GENERAL:alert, no distress and comfortable SKIN: skin color, texture, turgor are normal, no rashes or significant lesions EYES: PERLA; Conjunctiva are pink and non-injected, sclera clear.  Frequently blinking both eyes. ENT: No sinus tenderness.. OROPHARYNX:no exudate, no erythema on lips, buccal mucosa, or tongue. NECK: supple, thyroid normal size, non-tender, without nodularity. No  masses CHEST: Status post bilateral mastectomy with failed implant on the right and stable implant on the left with no axillary masses felt. LYMPH:  no palpable lymphadenopathy in the cervical, axillary or inguinal LUNGS: clear to auscultation and percussion with normal breathing effort HEART: regular rate & rhythm and no murmurs and no lower extremity edema ABDOMEN:abdomen soft, non-tender and normal bowel sounds MUSCULOSKELETAL:no cyanosis of digits and no clubbing. Range of motion normal.  NEURO: alert & oriented x 3 with fluent speech, no focal motor/sensory deficits   LABORATORY DATA: No visits with results within 30 Day(s) from this visit. Latest known visit with results is:  Admission on 07/11/2012, Discharged on 07/12/2012  Component Date Value Range Status  . WBC 07/11/2012 10.9* 4.0 - 10.5 K/uL Final  . RBC 07/11/2012 4.75  3.87 - 5.11 MIL/uL Final  . Hemoglobin 07/11/2012 14.9  12.0 - 15.0 g/dL Final  . HCT 36/64/4034 41.2  36.0 - 46.0 % Final  . MCV 07/11/2012 86.7  78.0 - 100.0 fL Final  . MCH 07/11/2012 31.4  26.0 - 34.0 pg Final  . MCHC 07/11/2012 36.2* 30.0 - 36.0 g/dL Final  . RDW 74/25/9563 13.0  11.5 - 15.5 % Final  . Platelets 07/11/2012 239  150 - 400 K/uL Final  . Sodium 07/11/2012 128* 135 - 145 mEq/L Final  . Potassium 07/11/2012 3.4* 3.5 - 5.1 mEq/L Final  . Chloride 07/11/2012 90* 96 - 112 mEq/L Final  . CO2 07/11/2012 21  19 - 32 mEq/L Final  . Glucose, Bld 07/11/2012 123* 70 - 99 mg/dL Final  . BUN 87/56/4332 12  6 - 23 mg/dL Final  . Creatinine, Ser 07/11/2012 0.75  0.50 - 1.10 mg/dL Final  . Calcium 95/18/8416 9.5  8.4 - 10.5 mg/dL Final  . GFR calc non Af Amer 07/11/2012 >90  >90 mL/min Final  . GFR calc Af Amer 07/11/2012 >90  >90 mL/min Final   Comment:                                 The eGFR has been calculated                          using the CKD EPI equation.                          This calculation has not been                           validated in all clinical                          situations.                          eGFR's persistently                          <90 mL/min signify                          possible Chronic Kidney Disease.  Marland Kitchen  Troponin I 07/11/2012 <0.30  <0.30 ng/mL Final   Comment:                                 Due to the release kinetics of cTnI,                          a negative result within the first hours                          of the onset of symptoms does not rule out                          myocardial infarction with certainty.                          If myocardial infarction is still suspected,                          repeat the test at appropriate intervals.  Marland Kitchen D-Dimer, Quant 07/11/2012 <0.27  0.00 - 0.48 ug/mL-FEU Final   Comment:                                 AT THE INHOUSE ESTABLISHED CUTOFF                          VALUE OF 0.48 ug/mL FEU,                          THIS ASSAY HAS BEEN DOCUMENTED                          IN THE LITERATURE TO HAVE                          A SENSITIVITY AND NEGATIVE                          PREDICTIVE VALUE OF AT LEAST                          98 TO 99%.  THE TEST RESULT                          SHOULD BE CORRELATED WITH                          AN ASSESSMENT OF THE CLINICAL                          PROBABILITY OF DVT / VTE.    PATHOLOGY:  Urinalysis No results found for this basename: colorurine, appearanceur, labspec, phurine, glucoseu, hgbur, bilirubinur, ketonesur, proteinur, urobilinogen, nitrite, leukocytesur    RADIOGRAPHIC STUDIES: No results found.  ASSESSMENT: #1. 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 Subpleural lymph nodes are noted as enlarged on last CT scan done in May of  2014. #2. Allergic rhinitis. #3. Dry eye syndrome.   PLAN: #1. Continue anastrozole 1 mg daily. #2. All ophthalmologist to order  Restasis ophthalmic solution. #3. Allegra-D one tablet  a day ordered #4. Flonase inhaler 2 puffs in each nostril at bedtime. #5. Office visit in 3 months with lab tests.   All questions were answered. The patient knows to call the clinic with any problems, questions or concerns. We can certainly see the patient much sooner if necessary.   I spent 25 minutes counseling the patient face to face. The total time spent in the appointment was 30 minutes.    Maurilio Lovely, MD 02/21/2013 1:23 PM

## 2013-02-21 NOTE — Progress Notes (Signed)
Krista Moore presented for labwork. Labs per MD order drawn via Peripheral Line 23 gauge needle inserted in Right AC  Good blood return present. Procedure without incident.  Needle removed intact. Patient tolerated procedure well.

## 2013-02-21 NOTE — Addendum Note (Signed)
Addended by: Alla German A on: 02/21/2013 07:45 PM   Modules accepted: Orders

## 2013-02-22 LAB — CANCER ANTIGEN 27.29: CA 27.29: 12 U/mL (ref 0–39)

## 2013-02-22 LAB — CEA: CEA: 2.3 ng/mL (ref 0.0–5.0)

## 2013-05-23 ENCOUNTER — Other Ambulatory Visit (HOSPITAL_COMMUNITY): Payer: BC Managed Care – PPO

## 2013-05-24 ENCOUNTER — Encounter (HOSPITAL_COMMUNITY): Payer: Self-pay

## 2013-05-24 ENCOUNTER — Encounter (HOSPITAL_COMMUNITY): Payer: BC Managed Care – PPO | Attending: Hematology and Oncology

## 2013-05-24 VITALS — BP 168/94 | HR 93 | Temp 98.1°F | Resp 18 | Wt 159.7 lb

## 2013-05-24 DIAGNOSIS — M7580 Other shoulder lesions, unspecified shoulder: Secondary | ICD-10-CM

## 2013-05-24 DIAGNOSIS — C50919 Malignant neoplasm of unspecified site of unspecified female breast: Secondary | ICD-10-CM

## 2013-05-24 DIAGNOSIS — M25819 Other specified joint disorders, unspecified shoulder: Secondary | ICD-10-CM | POA: Insufficient documentation

## 2013-05-24 DIAGNOSIS — C50912 Malignant neoplasm of unspecified site of left female breast: Secondary | ICD-10-CM

## 2013-05-24 DIAGNOSIS — Z853 Personal history of malignant neoplasm of breast: Secondary | ICD-10-CM | POA: Insufficient documentation

## 2013-05-24 DIAGNOSIS — Z09 Encounter for follow-up examination after completed treatment for conditions other than malignant neoplasm: Secondary | ICD-10-CM | POA: Insufficient documentation

## 2013-05-24 LAB — CBC WITH DIFFERENTIAL/PLATELET
Basophils Absolute: 0.1 10*3/uL (ref 0.0–0.1)
Basophils Relative: 1 % (ref 0–1)
Eosinophils Absolute: 0.3 10*3/uL (ref 0.0–0.7)
Eosinophils Relative: 3 % (ref 0–5)
HEMATOCRIT: 38.6 % (ref 36.0–46.0)
Hemoglobin: 13 g/dL (ref 12.0–15.0)
LYMPHS ABS: 1.6 10*3/uL (ref 0.7–4.0)
Lymphocytes Relative: 21 % (ref 12–46)
MCH: 29.9 pg (ref 26.0–34.0)
MCHC: 33.7 g/dL (ref 30.0–36.0)
MCV: 88.7 fL (ref 78.0–100.0)
MONO ABS: 0.9 10*3/uL (ref 0.1–1.0)
MONOS PCT: 11 % (ref 3–12)
Neutro Abs: 4.8 10*3/uL (ref 1.7–7.7)
Neutrophils Relative %: 63 % (ref 43–77)
Platelets: 281 10*3/uL (ref 150–400)
RBC: 4.35 MIL/uL (ref 3.87–5.11)
RDW: 13 % (ref 11.5–15.5)
WBC: 7.6 10*3/uL (ref 4.0–10.5)

## 2013-05-24 LAB — COMPREHENSIVE METABOLIC PANEL
ALT: 15 U/L (ref 0–35)
AST: 22 U/L (ref 0–37)
Albumin: 4.1 g/dL (ref 3.5–5.2)
Alkaline Phosphatase: 66 U/L (ref 39–117)
BUN: 18 mg/dL (ref 6–23)
CALCIUM: 9.4 mg/dL (ref 8.4–10.5)
CO2: 24 meq/L (ref 19–32)
CREATININE: 0.94 mg/dL (ref 0.50–1.10)
Chloride: 96 mEq/L (ref 96–112)
GFR, EST AFRICAN AMERICAN: 77 mL/min — AB (ref >90)
GFR, EST NON AFRICAN AMERICAN: 66 mL/min — AB (ref >90)
GLUCOSE: 102 mg/dL — AB (ref 70–99)
Potassium: 4.7 mEq/L (ref 3.7–5.3)
Sodium: 134 mEq/L — ABNORMAL LOW (ref 137–147)
Total Bilirubin: 0.3 mg/dL (ref 0.3–1.2)
Total Protein: 7.4 g/dL (ref 6.0–8.3)

## 2013-05-24 NOTE — Progress Notes (Signed)
Krista Moore presented for labwork. Labs per MD order drawn via Peripheral Line 23 gauge needle inserted in right antecubital.  Good blood return present. Procedure without incident.  Needle removed intact. Patient tolerated procedure well.

## 2013-05-24 NOTE — Progress Notes (Signed)
[facility]  OFFICE PROGRESS NOTE  Deloria Lair, MD [address], [place] [address]  DIAGNOSIS: Breast cancer, left - Plan: CBC with Differential, Comprehensive metabolic panel, CEA, Cancer antigen 27.29  Chief Complaint  Patient presents with  . Breast Cancer    CURRENT THERAPY: Anastrozole 1 mg daily Followup of stage I left breast cancer originally diagnosed in 2006, currently taking anastrozole 1 mg daily having undergone bilateral mastectomy with reconstruction. She developed recurrence in the left chest wall in 2011 which was ER positive and since then the patient has been on anastrozole 1 mg daily. She has recently developed discomfort involving the left shoulder and was evaluated by her PCP and treated with nonsteroidal anti-inflammatory agents. Tendinitis is the presumptive diagnosis. Appetite has been good with no nausea, vomiting, hot flashes, vaginal dryness, dysuria, hematuria, lower extremity swelling or redness, left upper extremity swelling, cough, wheezing, throat, skin rash, headache, or seizures.  INTERVAL HISTORY: Krista Moore 58 y.o. female returns for followup of recurrent breast cancer with chest wall involvement, status post excision and anastrozole which was started in 2011. j  MEDICAL HISTORY: Past Medical History  Diagnosis Date  . Breast cancer 2001    left side  . Breast cancer     Recurrent  . Anxiety   . Tendonitis     left shoulder    INTERIM HISTORY: has Breast cancer on her problem list.   Initially diagnosed with a T1 B. breast cancer in 2001 status post lumpectomy followed by radiation therapy. She then had a recurrence to the left breast 2006 status post bilateral mastectomies at that time. She declined hormonal intervention at that juncture. She then had recurrent disease the left chest wall in 2011 status post biopsy proven disease followed by the use of anastrozole which she still takes  today. She tolerates the anastrozole extremely well without obvious side effects. Her cancer was said to be estrogen receptor +80%, PR +10% at presentation with her recurrence in 2006 your receptors 95%, PR receptors 20%, HER-2/neu nonamplified. She underwent TRAM reconstruction in July 2007. I do not have receptor studies from the 2011 recurrence. She has never had tamoxifen or chemotherapy  ALLERGIES:  is allergic to codeine; codeine; latex; morphine and related; and morphine and related.  MEDICATIONS: has a current medication list which includes the following prescription(s): anastrozole, carboxymethylcellulose sodium, clonazepam, multivitamin with minerals, ranitidine, sertraline, vitamin b-12, vitamin c, fexofenadine-pseudoephedrine, fish oil-omega-3 fatty acids, and fluticasone.  SURGICAL HISTORY:  Past Surgical History  Procedure Laterality Date  . Mastectomy partial / lumpectomy w/ axillary lymphadenectomy  2001    left  . Mastectomy, radical  2006    right and left  . Breast lumpectomy  2011    left breast  . Mastectomy partial / lumpectomy w/ axillary lymphadenectomy  2001 left breast  . Mastectomy, radical  2006 right and left breast  . Breast reconstruction  2006  . Breast lumpectomy  2011    FAMILY HISTORY: family history includes Cancer in her mother; Diabetes in her father.  SOCIAL HISTORY:  reports that she has been smoking Cigarettes.  She has a 30 pack-year smoking history. She has never used smokeless tobacco. She reports that she drinks alcohol. She reports that she does not use illicit drugs.  REVIEW OF SYSTEMS:  Other than that discussed above is noncontributory.  PHYSICAL EXAMINATION: ECOG PERFORMANCE STATUS: 1 - Symptomatic but completely ambulatory  Blood  pressure 168/94, pulse 93, temperature 98.1 F (36.7 C), temperature source Oral, resp. rate 18, weight 159 lb 11.2 oz (72.439 kg).  GENERAL:alert, no distress and comfortable SKIN: skin color, texture,  turgor are normal, no rashes or significant lesions EYES: PERLA; Conjunctiva are pink and non-injected, sclera clear OROPHARYNX:no exudate, no erythema on lips, buccal mucosa, or tongue. NECK: supple, thyroid normal size, non-tender, without nodularity. No masses CHEST: Status post bilateral mastectomy with failed implant on the right side and stable implant on the left with no axillary masses felt. LYMPH:  no palpable lymphadenopathy in the cervical, axillary or inguinal LUNGS: clear to auscultation and percussion with normal breathing effort HEART: regular rate & rhythm and no murmurs. ABDOMEN:abdomen soft, non-tender and normal bowel sounds MUSCULOSKELETAL:no cyanosis of digits and no clubbing. Range of motion normal.. No lymphedema. . Tenderness over the left deltoid tendon. NEURO: alert & oriented x 3 with fluent speech, no focal motor/sensory deficits   LABORATORY DATA: No visits with results within 30 Day(s) from this visit. Latest known visit with results is:  Office Visit on 02/21/2013  Component Date Value Range Status  . WBC 02/21/2013 8.1  4.0 - 10.5 K/uL Final  . RBC 02/21/2013 4.39  3.87 - 5.11 MIL/uL Final  . Hemoglobin 02/21/2013 13.5  12.0 - 15.0 g/dL Final  . HCT 02/21/2013 39.1  36.0 - 46.0 % Final  . MCV 02/21/2013 89.1  78.0 - 100.0 fL Final  . MCH 02/21/2013 30.8  26.0 - 34.0 pg Final  . MCHC 02/21/2013 34.5  30.0 - 36.0 g/dL Final  . RDW 02/21/2013 13.1  11.5 - 15.5 % Final  . Platelets 02/21/2013 263  150 - 400 K/uL Final  . Neutrophils Relative % 02/21/2013 67  43 - 77 % Final  . Neutro Abs 02/21/2013 5.4  1.7 - 7.7 K/uL Final  . Lymphocytes Relative 02/21/2013 18  12 - 46 % Final  . Lymphs Abs 02/21/2013 1.5  0.7 - 4.0 K/uL Final  . Monocytes Relative 02/21/2013 12  3 - 12 % Final  . Monocytes Absolute 02/21/2013 0.9  0.1 - 1.0 K/uL Final  . Eosinophils Relative 02/21/2013 3  0 - 5 % Final  . Eosinophils Absolute 02/21/2013 0.2  0.0 - 0.7 K/uL Final  .  Basophils Relative 02/21/2013 0  0 - 1 % Final  . Basophils Absolute 02/21/2013 0.0  0.0 - 0.1 K/uL Final  . Sodium 02/21/2013 134* 135 - 145 mEq/L Final  . Potassium 02/21/2013 4.0  3.5 - 5.1 mEq/L Final  . Chloride 02/21/2013 96  96 - 112 mEq/L Final  . CO2 02/21/2013 25  19 - 32 mEq/L Final  . Glucose, Bld 02/21/2013 99  70 - 99 mg/dL Final  . BUN 02/21/2013 10  6 - 23 mg/dL Final  . Creatinine, Ser 02/21/2013 0.73  0.50 - 1.10 mg/dL Final  . Calcium 02/21/2013 9.7  8.4 - 10.5 mg/dL Final  . Total Protein 02/21/2013 7.3  6.0 - 8.3 g/dL Final  . Albumin 02/21/2013 4.2  3.5 - 5.2 g/dL Final  . AST 02/21/2013 21  0 - 37 U/L Final  . ALT 02/21/2013 12  0 - 35 U/L Final  . Alkaline Phosphatase 02/21/2013 74  39 - 117 U/L Final  . Total Bilirubin 02/21/2013 0.3  0.3 - 1.2 mg/dL Final  . GFR calc non Af Amer 02/21/2013 >90  >90 mL/min Final  . GFR calc Af Amer 02/21/2013 >90  >90 mL/min Final   Comment: (  NOTE)                          The eGFR has been calculated using the CKD EPI equation.                          This calculation has not been validated in all clinical situations.                          eGFR's persistently <90 mL/min signify possible Chronic Kidney                          Disease.  . CEA 02/21/2013 2.3  0.0 - 5.0 ng/mL Final   Performed at [facility]  . CA 27.29 02/21/2013 12  0 - 39 U/mL Final   Performed at [facility]: No new pathology.  Urinalysis No results found for this basename: colorurine,  appearanceur,  labspec,  phurine,  glucoseu,  hgbur,  bilirubinur,  ketonesur,  proteinur,  urobilinogen,  nitrite,  leukocytesur    RADIOGRAPHIC STUDIES: No results found.  ASSESSMENT:  #1. Stage IV breast cancer with chest wall relapse, tolerating anastrozole well with enlarged subpleural lymph nodes enlarged on CT scan done in May 2014. #2. Allergic rhinitis #3. Dry eye syndrome. #4. Left shoulder tendinitis.   PLAN:  #1.  Continue anastrozole 1 mg daily. #2. Followup in 6 months with lab tests.   All questions were answered. The patient knows to call the clinic with any problems, questions or concerns. We can certainly see the patient much sooner if necessary.   I spent 25 minutes counseling the patient face to face. The total time spent in the appointment was 40 minutes.    Doroteo Bradford, MD 05/24/2013 3:07 PM

## 2013-05-24 NOTE — Patient Instructions (Signed)
[facility] Discharge Instructions  RECOMMENDATIONS MADE BY THE CONSULTANT AND ANY TEST RESULTS WILL BE SENT TO YOUR REFERRING PHYSICIAN.  Lab work today. We will call you if there are any abnormal results. Continue Arimidex (anastrazole) as prescribed. Return to clinic in 6 months for follow up. Report any issues/concerns to clinic as needed prior to appointment.  Thank you for choosing [facility] to provide your oncology and hematology care.  To afford each patient quality time with our providers, please arrive at least 15 minutes before your scheduled appointment time.  With your help, our goal is to use those 15 minutes to complete the necessary work-up to ensure our physicians have the information they need to help with your evaluation and healthcare recommendations.    Effective January 1st, 2014, we ask that you re-schedule your appointment with our physicians should you arrive 10 or more minutes late for your appointment.  We strive to give you quality time with our providers, and arriving late affects you and other patients whose appointments are after yours.    Again, thank you for choosing [facility].  Our hope is that these requests will decrease the amount of time that you wait before being seen by our physicians.       _____________________________________________________________  Should you have questions after your visit to [facility], please contact our office at [phone] between the hours of 8:30 a.m. and 5:00 p.m.  Voicemails left after 4:30 p.m. will not be returned until the following business day.  For prescription refill requests, have your pharmacy contact our office with your prescription refill request.

## 2013-05-25 LAB — CANCER ANTIGEN 27.29: CA 27.29: 11 U/mL (ref 0–39)

## 2013-05-25 LAB — CEA: CEA: 2.5 ng/mL (ref 0.0–5.0)

## 2013-11-21 ENCOUNTER — Ambulatory Visit (HOSPITAL_COMMUNITY): Payer: BC Managed Care – PPO

## 2013-11-22 NOTE — Progress Notes (Signed)
This encounter was created in error - please disregard.

## 2013-11-29 ENCOUNTER — Encounter (HOSPITAL_COMMUNITY): Payer: Self-pay

## 2018-01-09 ENCOUNTER — Encounter (HOSPITAL_COMMUNITY): Payer: Self-pay

## 2018-01-09 ENCOUNTER — Emergency Department (HOSPITAL_COMMUNITY): Payer: Self-pay

## 2018-01-09 ENCOUNTER — Emergency Department (HOSPITAL_COMMUNITY)
Admission: EM | Admit: 2018-01-09 | Discharge: 2018-01-09 | Disposition: A | Discharge status: Home / Self Care | Payer: Self-pay | Attending: Emergency Medicine | Admitting: Emergency Medicine

## 2018-01-09 ENCOUNTER — Other Ambulatory Visit: Payer: Self-pay

## 2018-01-09 DIAGNOSIS — Z853 Personal history of malignant neoplasm of breast: Secondary | ICD-10-CM | POA: Insufficient documentation

## 2018-01-09 DIAGNOSIS — F10120 Alcohol abuse with intoxication, uncomplicated: Secondary | ICD-10-CM | POA: Insufficient documentation

## 2018-01-09 DIAGNOSIS — W01198A Fall on same level from slipping, tripping and stumbling with subsequent striking against other object, initial encounter: Secondary | ICD-10-CM | POA: Insufficient documentation

## 2018-01-09 DIAGNOSIS — Y9389 Activity, other specified: Secondary | ICD-10-CM | POA: Insufficient documentation

## 2018-01-09 DIAGNOSIS — Z79899 Other long term (current) drug therapy: Secondary | ICD-10-CM | POA: Insufficient documentation

## 2018-01-09 DIAGNOSIS — T07XXXA Unspecified multiple injuries, initial encounter: Secondary | ICD-10-CM

## 2018-01-09 DIAGNOSIS — F17210 Nicotine dependence, cigarettes, uncomplicated: Secondary | ICD-10-CM | POA: Insufficient documentation

## 2018-01-09 DIAGNOSIS — W19XXXA Unspecified fall, initial encounter: Secondary | ICD-10-CM

## 2018-01-09 DIAGNOSIS — Z91040 Latex allergy status: Secondary | ICD-10-CM | POA: Insufficient documentation

## 2018-01-09 DIAGNOSIS — Y929 Unspecified place or not applicable: Secondary | ICD-10-CM | POA: Insufficient documentation

## 2018-01-09 DIAGNOSIS — S50311A Abrasion of right elbow, initial encounter: Secondary | ICD-10-CM | POA: Insufficient documentation

## 2018-01-09 DIAGNOSIS — F10920 Alcohol use, unspecified with intoxication, uncomplicated: Secondary | ICD-10-CM

## 2018-01-09 DIAGNOSIS — F419 Anxiety disorder, unspecified: Secondary | ICD-10-CM | POA: Insufficient documentation

## 2018-01-09 DIAGNOSIS — Y998 Other external cause status: Secondary | ICD-10-CM | POA: Insufficient documentation

## 2018-01-09 MED ORDER — DOXYCYCLINE HYCLATE 100 MG PO TABS
100.0000 mg | ORAL_TABLET | Freq: Once | ORAL | Status: AC
  Administered 2018-01-09: 100 mg via ORAL
  Filled 2018-01-09: qty 1

## 2018-01-09 MED ORDER — AMOXICILLIN-POT CLAVULANATE 875-125 MG PO TABS: 1.0000 | ORAL_TABLET | Freq: Two times a day (BID) | ORAL | 0 refills | Status: AC

## 2018-01-09 MED ORDER — DOXYCYCLINE HYCLATE 100 MG PO CAPS: 100.0000 mg | ORAL_CAPSULE | Freq: Two times a day (BID) | ORAL | 0 refills | Status: AC

## 2018-01-09 MED ORDER — AMOXICILLIN-POT CLAVULANATE 875-125 MG PO TABS
1.0000 | ORAL_TABLET | Freq: Once | ORAL | Status: AC
  Administered 2018-01-09: 1 via ORAL
  Filled 2018-01-09: qty 1

## 2018-01-09 NOTE — Discharge Instructions (Addendum)
Reduce your drinking.  Your x-rays are negative.  Take the antibiotics for possible pneumonia.  Follow-up with your primary doctor.  Return to the ED if develop chest pain, shortness of breath or any other concerns.

## 2018-01-09 NOTE — ED Provider Notes (Signed)
[facility] EMERGENCY DEPARTMENT Provider Note   CSN: [id number] Arrival date & time: 01/09/18  0148     History   Chief Complaint Chief Complaint  Patient presents with  . Fall    HPI Krista Moore is a 62 y.o. female.  Level 5 caveat for intoxication.  Patient presents after fall.  Admits to drinking several drinks tonight of "homemade wine but it might of been moonshine".  States she fell backwards striking her head on a brick wall scraping her elbow and back.  Denies losing consciousness.  No vomiting.  Complains of pain to her right elbow right side of her neck and low back.  No focal weakness, numbness or tingling.  She does not take any blood thinners.  No chest pain or abdominal pain.  She is been ambulatory since the accident.  Tetanus shot is up-to-date.  The history is provided by the patient.  Fall  Associated symptoms include headaches. Pertinent negatives include no chest pain and no abdominal pain.    Past Medical History:  Diagnosis Date  . Anxiety   . Breast cancer ([place]) 2001   left side  . Breast cancer ([place])    Recurrent  . Tendonitis    left shoulder    Patient Active Problem List   Diagnosis Date Noted  . Breast cancer ([place]) 09/01/2011    Past Surgical History:  Procedure Laterality Date  . BREAST LUMPECTOMY  2011   left breast  . BREAST LUMPECTOMY  2011  . BREAST RECONSTRUCTION  2006  . MASTECTOMY PARTIAL / LUMPECTOMY W/ AXILLARY LYMPHADENECTOMY  2001   left  . MASTECTOMY PARTIAL / LUMPECTOMY W/ AXILLARY LYMPHADENECTOMY  2001 left breast  . MASTECTOMY, RADICAL  2006   right and left  . MASTECTOMY, RADICAL  2006 right and left breast     OB History   None      Home Medications    Prior to Admission medications   Medication Sig Start Date End Date Taking? Authorizing Provider  anastrozole (ARIMIDEX) 1 MG tablet Take 1 mg by mouth every evening.    [provider]  Carboxymethylcellulose Sodium (THERATEARS) 0.25 % SOLN  Apply 1 drop to eye every 4 (four) hours.    [provider]  clonazePAM (KLONOPIN) 0.5 MG tablet Take 0.5 mg by mouth 3 (three) times daily as needed for anxiety.     [provider]  fexofenadine-pseudoephedrine (ALLEGRA-D 24 HOUR) 180-240 MG per 24 hr tablet Take 1 tablet by mouth daily. 02/21/13   Farrel Gobble, MD  fish oil-omega-3 fatty acids 1000 MG capsule Take 1 g by mouth daily.    [provider]  fluticasone (FLONASE) 50 MCG/ACT nasal spray Place 2 sprays into the nose daily. 02/21/13   Farrel Gobble, MD  Multiple Vitamins-Minerals (MULTIVITAMIN WITH MINERALS) tablet Take 1 tablet by mouth daily.    [provider]  ranitidine (ZANTAC) 150 MG tablet Take 150 mg by mouth 2 (two) times daily.    [provider]  sertraline (ZOLOFT) 100 MG tablet Take 100 mg by mouth every evening.     [provider]  vitamin B-12 (CYANOCOBALAMIN) 100 MCG tablet Take 100 mcg by mouth daily.    [provider]  vitamin C (ASCORBIC ACID) 500 MG tablet Take 1,000 mg by mouth daily.     [provider]    Family History Family History  Problem Relation Age of Onset  . Cancer Mother  breast cancer  . Diabetes Father     Social History Social History   Tobacco Use  . Smoking status: Current Every Day Smoker    Packs/day: 1.00    Years: 30.00    Pack years: 30.00    Types: Cigarettes  . Smokeless tobacco: Never Used  Substance Use Topics  . Alcohol use: Yes    Comment: beer weekly  . Drug use: Yes    Types: Marijuana     Allergies   Codeine; Codeine; Latex; Morphine and related; and Morphine and related   Review of Systems Review of Systems  Constitutional: Negative for activity change, appetite change and fever.  Respiratory: Negative for chest tightness.   Cardiovascular: Negative for chest pain.  Gastrointestinal: Negative for abdominal pain, nausea and vomiting.  Genitourinary: Negative for  dysuria, vaginal bleeding and vaginal discharge.  Musculoskeletal: Positive for arthralgias, back pain, myalgias and neck pain.  Skin: Positive for wound.  Neurological: Positive for headaches. Negative for dizziness, weakness and numbness.   all other systems are negative except as noted in the HPI and PMH.     Physical Exam Updated Vital Signs BP (!) 153/80 (BP Location: Right Arm)   Pulse 69   Temp 98 F (36.7 C) (Oral)   Resp 18   Ht 5\' 7"  (1.702 m)   Wt 61.2 kg   SpO2 99%   BMI 21.14 kg/m   Physical Exam  Constitutional: She is oriented to person, place, and time. She appears well-developed and well-nourished. No distress.  Smells of alcohol  HENT:  Head: Normocephalic and atraumatic.  Mouth/Throat: Oropharynx is clear and moist. No oropharyngeal exudate.  Eyes: Pupils are equal, round, and reactive to light. Conjunctivae and EOM are normal.  Neck: Normal range of motion. Neck supple.  Right paraspinal C-spine tenderness, no step-offs, no midline tenderness  Cardiovascular: Normal rate, regular rhythm, normal heart sounds and intact distal pulses.  No murmur heard. Pulmonary/Chest: Effort normal and breath sounds normal. No respiratory distress. She exhibits no tenderness.  Abdominal: Soft. There is no tenderness. There is no rebound and no guarding.  Musculoskeletal: Normal range of motion. She exhibits tenderness. She exhibits no edema.  Abrasion to right elbow, no bony tenderness No T or L-spine tenderness  Pelvis is stable, hips are nontender  Neurological: She is alert and oriented to person, place, and time. No cranial nerve deficit. She exhibits normal muscle tone. Coordination normal.  No ataxia on finger to nose bilaterally. No pronator drift. 5/5 strength throughout. CN 2-12 intact.Equal grip strength. Sensation intact.   Skin: Skin is warm.  Psychiatric: She has a normal mood and affect. Her behavior is normal.  Nursing note and vitals reviewed.    ED  Treatments / Results  Labs (all labs ordered are listed, but only abnormal results are displayed) Labs Reviewed - No data to display  EKG None  Radiology Dg Chest 2 View  Result Date: 01/09/2018 CLINICAL DATA:  62 year old female with fall and back pain. History of breast cancer. EXAM: CHEST - 2 VIEW COMPARISON:  Chest CT dated 09/07/2012 and chest radiograph dated 07/11/2012 FINDINGS: An area of increased density in the right lower lobe may represent atelectasis or infiltrate. The left lung is clear. There is no pleural effusion or pneumothorax. The cardiac silhouette is within normal limits. Bilateral breast implants. No acute osseous pathology. Surgical clips in the left chest wall. IMPRESSION: Right lower lobe density may represent atelectasis versus infiltrate. Electronically Signed   By: Milas Hock  Radparvar M.D.   On: 01/09/2018 06:48   Dg Cervical Spine Complete  Result Date: 01/09/2018 CLINICAL DATA:  62 year old female with fall and neck pain. EXAM: CERVICAL SPINE - COMPLETE 4+ VIEW COMPARISON:  None. FINDINGS: There is no acute fracture or subluxation of the cervical spine. The visualized posterior elements and odontoid appear intact. There is anatomic alignment of the lateral masses of C1 and C2. There is multilevel degenerative changes primarily at C5-C6. There is narrowing of the C2-C6 neural foramina. The soft tissues are grossly unremarkable. IMPRESSION: 1. No acute/traumatic cervical spine pathology. 2. Osteopenia with degenerative changes. Electronically Signed   By: Anner Crete M.D.   On: 01/09/2018 03:31   Dg Pelvis 1-2 Views  Result Date: 01/09/2018 CLINICAL DATA:  [place] on porch. EXAM: PELVIS - 1-2 VIEW COMPARISON:  CT abdomen and pelvis March 01, 2001 FINDINGS: Femoral heads are well formed and located. Hip joint spaces are intact. Sacroiliac joints are symmetric. No destructive bony lesions. Included soft tissue planes are non-suspicious. Punctate phleboliths project in the  pelvis IMPRESSION: Negative. Electronically Signed   By: Elon Alas M.D.   On: 01/09/2018 06:49   Dg Elbow Complete Right  Result Date: 01/09/2018 CLINICAL DATA:  [place] on porch. EXAM: RIGHT ELBOW - COMPLETE 3+ VIEW COMPARISON:  None. FINDINGS: There is no evidence of fracture, dislocation, or joint effusion. There is no evidence of arthropathy or other focal bone abnormality. Soft tissues are unremarkable. IMPRESSION: Negative. Electronically Signed   By: Elon Alas M.D.   On: 01/09/2018 06:47   Ct Head Wo Contrast  Result Date: 01/09/2018 CLINICAL DATA:  Patient fell backwards on porch landing on back and hitting back of head after drinking alcohol. EXAM: CT HEAD WITHOUT CONTRAST TECHNIQUE: Contiguous axial images were obtained from the base of the skull through the vertex without intravenous contrast. COMPARISON:  None. FINDINGS: Brain: No evidence of acute infarction, hemorrhage, hydrocephalus, extra-axial collection or mass lesion/mass effect. Vascular: No hyperdense vessel or unexpected calcification. Skull: No acute skull fracture. Sinuses/Orbits: Mild-to-moderate ethmoid and sphenoid sinus mucosal thickening without air-fluid levels. Intact orbits and globes. The included maxillary and frontal sinuses are nonacute. Other: No significant calvarial soft tissue swelling. IMPRESSION: No acute intracranial abnormality or skull fracture. Electronically Signed   By: Ashley Royalty M.D.   On: 01/09/2018 03:22   Ct Cervical Spine Wo Contrast  Result Date: 01/09/2018 CLINICAL DATA:  62 year old female with trauma. EXAM: CT CERVICAL SPINE WITHOUT CONTRAST TECHNIQUE: Multidetector CT imaging of the cervical spine was performed without intravenous contrast. Multiplanar CT image reconstructions were also generated. COMPARISON:  Cervical spine radiograph dated 01/09/2018 FINDINGS: Alignment: No acute subluxation. Minimal reversal of normal cervical lordosis at C3-C6 which may be positional or due to  muscle spasm or degenerative changes. Skull base and vertebrae: No acute fracture. Soft tissues and spinal canal: No prevertebral fluid or swelling. No visible canal hematoma. Disc levels:  Degenerative changes primarily at C5-C6 and C6-C7. Upper chest: Negative. Other: Subcentimeter focus of calcification in the left thyroid gland. IMPRESSION: No acute/traumatic cervical spine pathology. Electronically Signed   By: Anner Crete M.D.   On: 01/09/2018 06:42    Procedures Procedures (including critical care time)  Medications Ordered in ED Medications - No data to display   Initial Impression / Assessment and Plan / ED Course  I have reviewed the triage vital signs and the nursing notes.  Pertinent labs & imaging results that were available during my care of the patient were reviewed  by me and considered in my medical decision making (see chart for details).    Fall while intoxicated with head, neck, elbow and low back pain.  No loss of consciousness.  CT head obtained in triage is negative.  Traumatic imaging is negative.  C-collar was cleared.  Patient is alert and oriented to person and place and ambulatory.  She is clinically sober.  She is ambulatory. X-ray shows area of possible pneumonia versus atelectasis.  Patient is a smoker and has been coughing so we will treat with antibiotics.  Advised to reduce her drinking and smoking.  Follow-up with her primary doctor.  Return precautions discussed.    Final Clinical Impressions(s) / ED Diagnoses   Final diagnoses:  Fall, initial encounter  Alcoholic intoxication without complication [facility])  Multiple contusions    ED Discharge Orders    None       Izzie Geers, Annie Main, MD 01/09/18 [phone]

## 2018-01-09 NOTE — ED Triage Notes (Signed)
Pt reports drinking "red wine, but thinks it was moonshine" and falling backwards on porch landing on back, scraped right elbow on brick, and hit back of head. Reports neck pain, right arm, and both legs. Denies LOC.

## 2019-03-01 ENCOUNTER — Other Ambulatory Visit: Payer: Self-pay | Admitting: Family Medicine

## 2019-03-25 ENCOUNTER — Other Ambulatory Visit: Payer: Self-pay | Admitting: Family Medicine

## 2019-03-25 DIAGNOSIS — M810 Age-related osteoporosis without current pathological fracture: Secondary | ICD-10-CM

## 2019-03-25 DIAGNOSIS — C50919 Malignant neoplasm of unspecified site of unspecified female breast: Secondary | ICD-10-CM

## 2019-04-05 ENCOUNTER — Other Ambulatory Visit: Payer: Self-pay | Admitting: Family Medicine

## 2019-06-10 DEATH — deceased

## 2019-09-04 IMAGING — DX DG CERVICAL SPINE COMPLETE 4+V
7 series · 7 of 7 positions shown · non-contrast
Comparison: None.

CLINICAL DATA: 61-year-old female with fall and neck pain.

EXAM:
CERVICAL SPINE - COMPLETE 4+ VIEW

[c-spine lat]
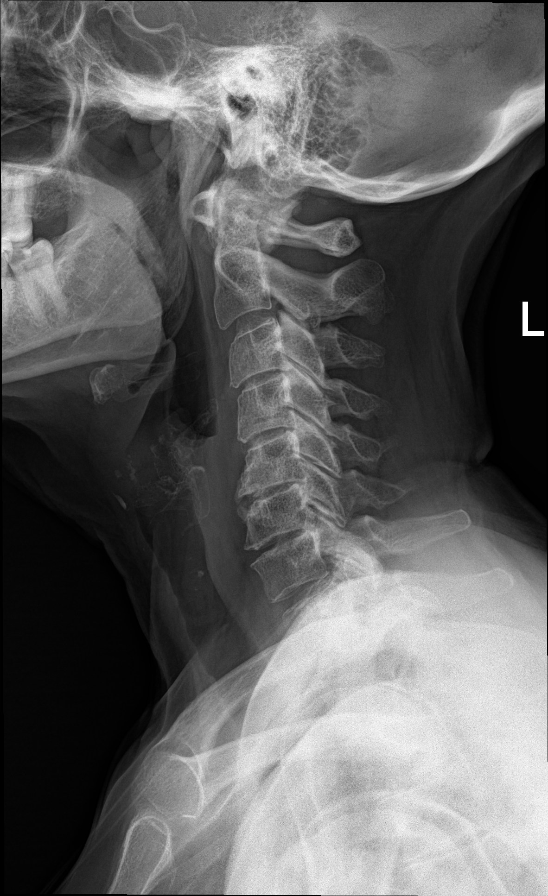

[c-spine obl (1 of 2)]
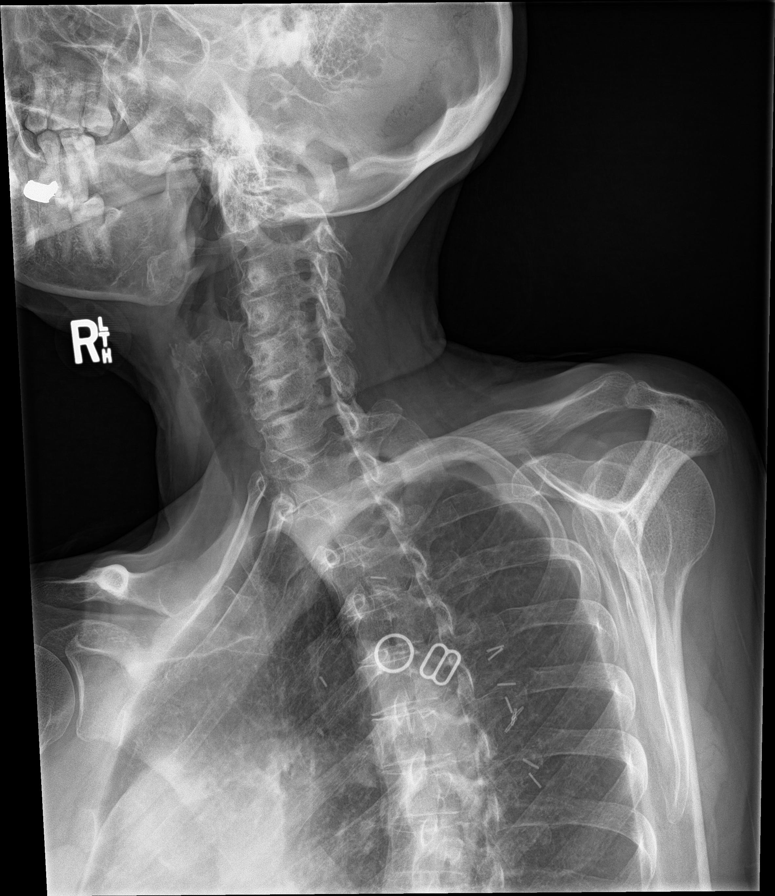

[c-spine obl (2 of 2)]
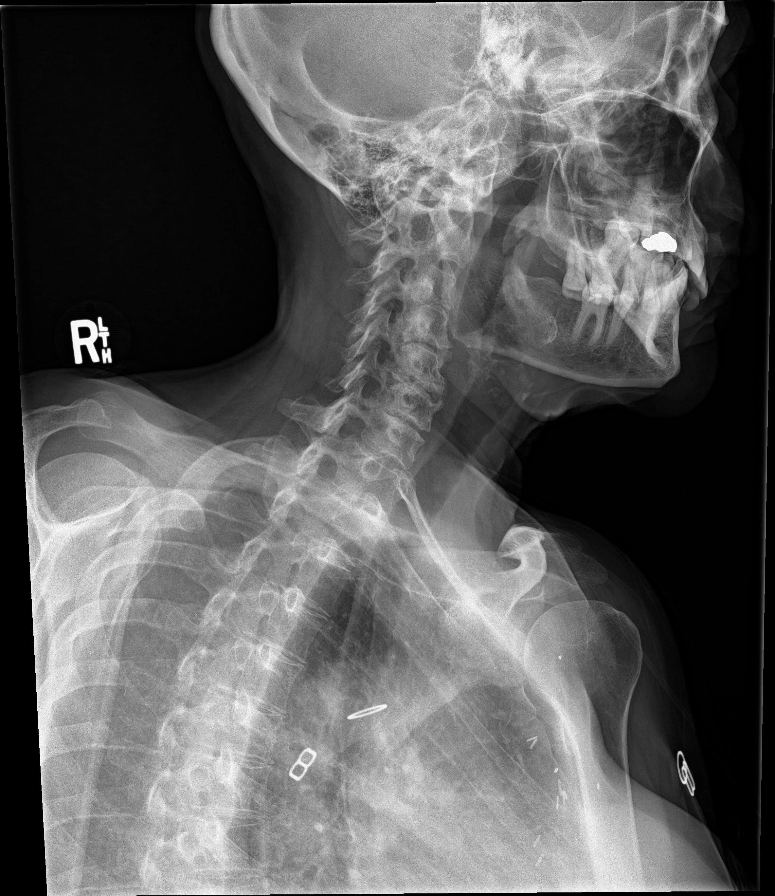

[c-spine ap]
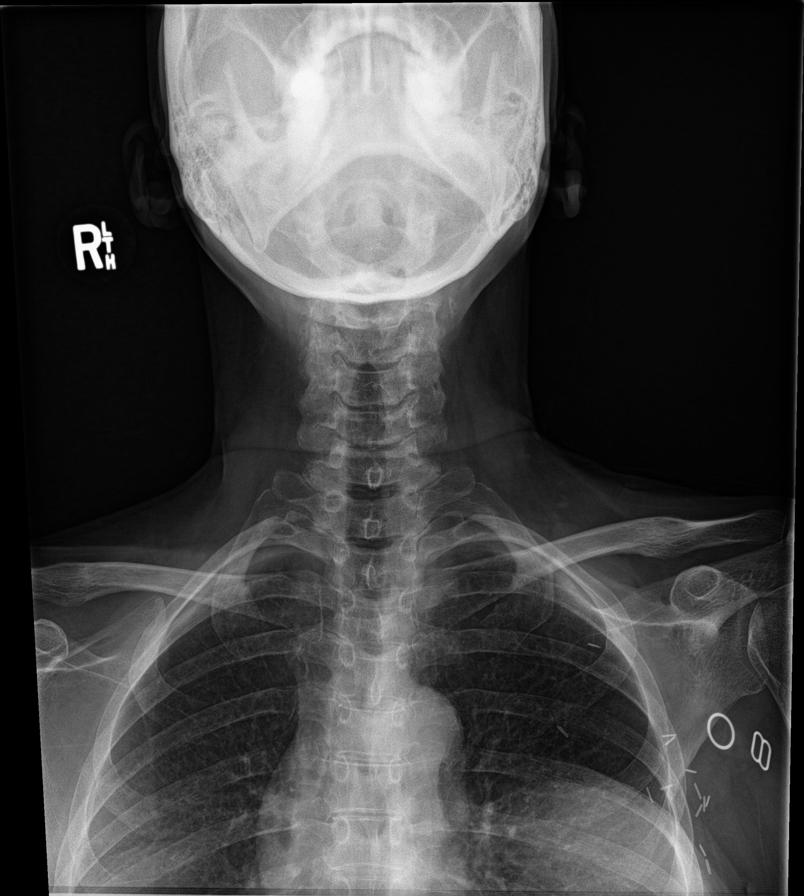

[c-spine open mouth (1 of 3)]
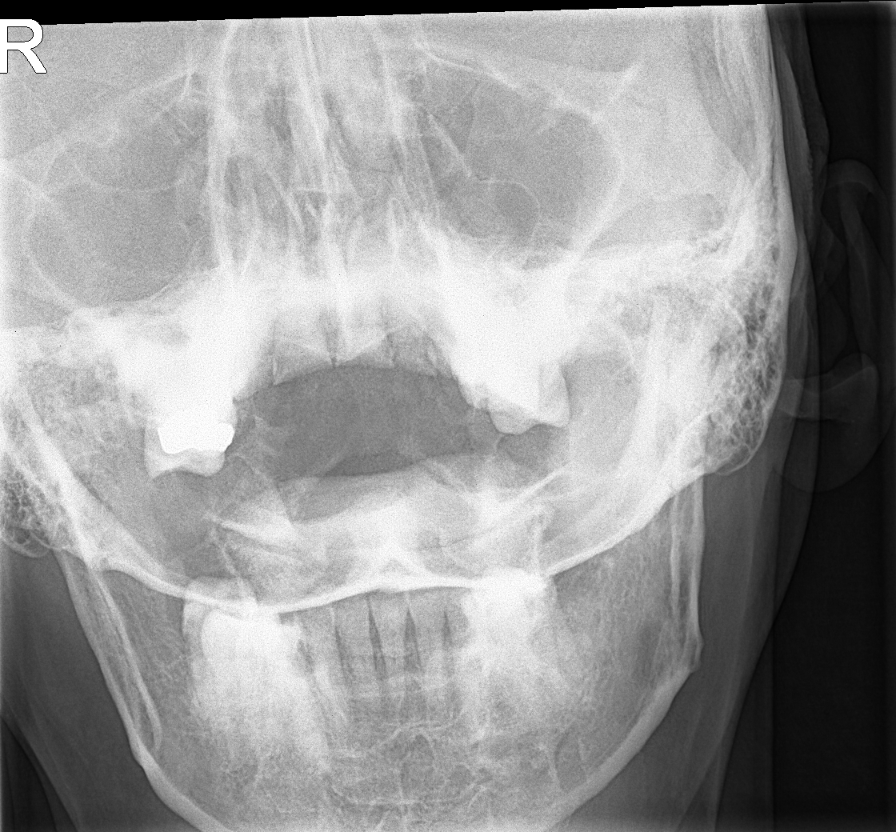

[c-spine open mouth (2 of 3)]
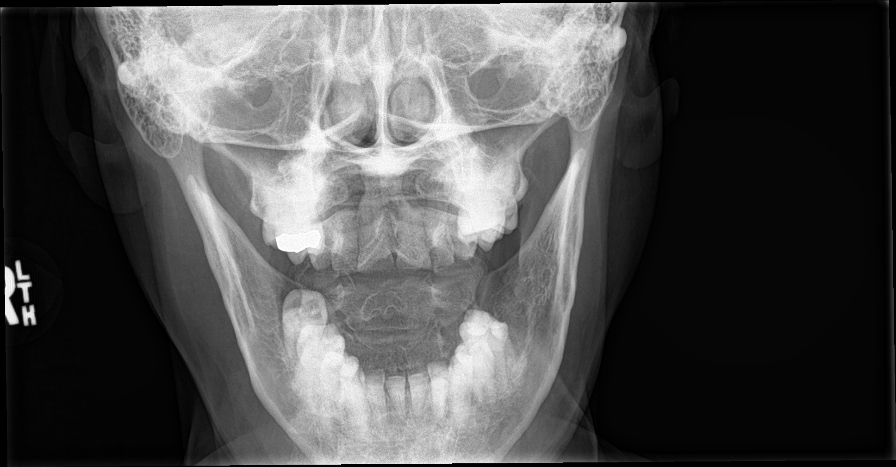

[c-spine open mouth (3 of 3)]
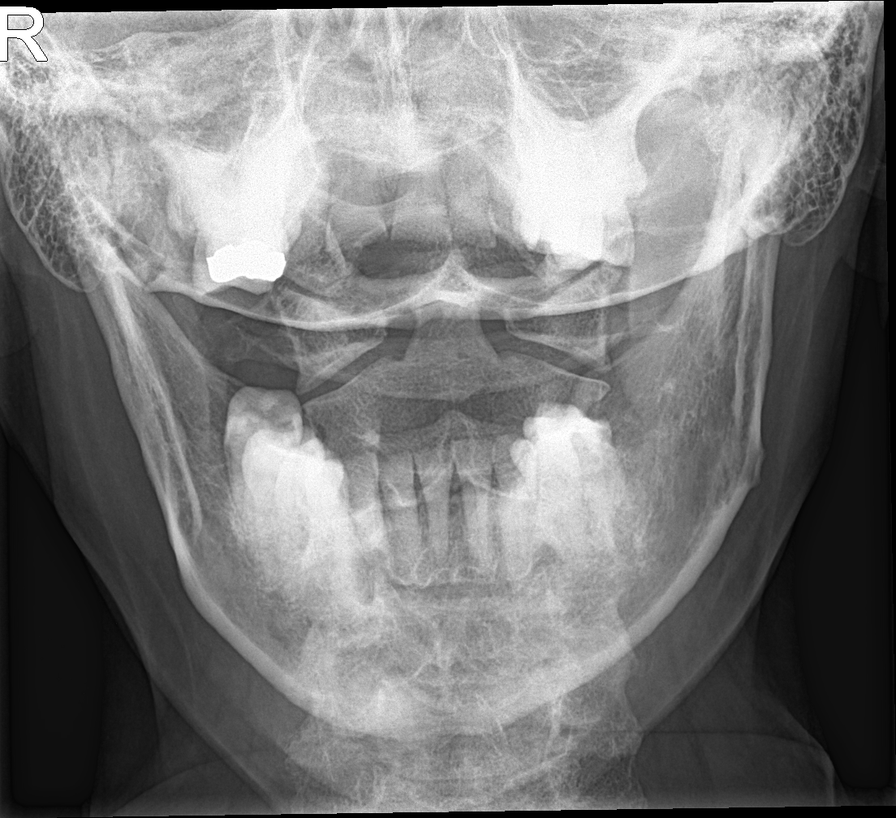

[7 of 7 positions shown; findings below may reference images not displayed]

FINDINGS: There is no acute fracture or subluxation of the cervical spine. The
visualized posterior elements and odontoid appear intact. There is
anatomic alignment of the lateral masses of C1 and C2. There is
multilevel degenerative changes primarily at C5-C6. There is
narrowing of the C2-C6 neural foramina. The soft tissues are grossly
unremarkable.
IMPRESSION: 1. No acute/traumatic cervical spine pathology.
2. Osteopenia with degenerative changes.

## 2019-09-04 IMAGING — DX DG CHEST 2V
2 series · 2 of 2 positions shown · non-contrast
Comparison: Chest CT dated 09/07/2012 and chest radiograph dated
07/11/2012

CLINICAL DATA: 61-year-old female with fall and back pain. History
of breast cancer.

EXAM:
CHEST - 2 VIEW

[chest pa]
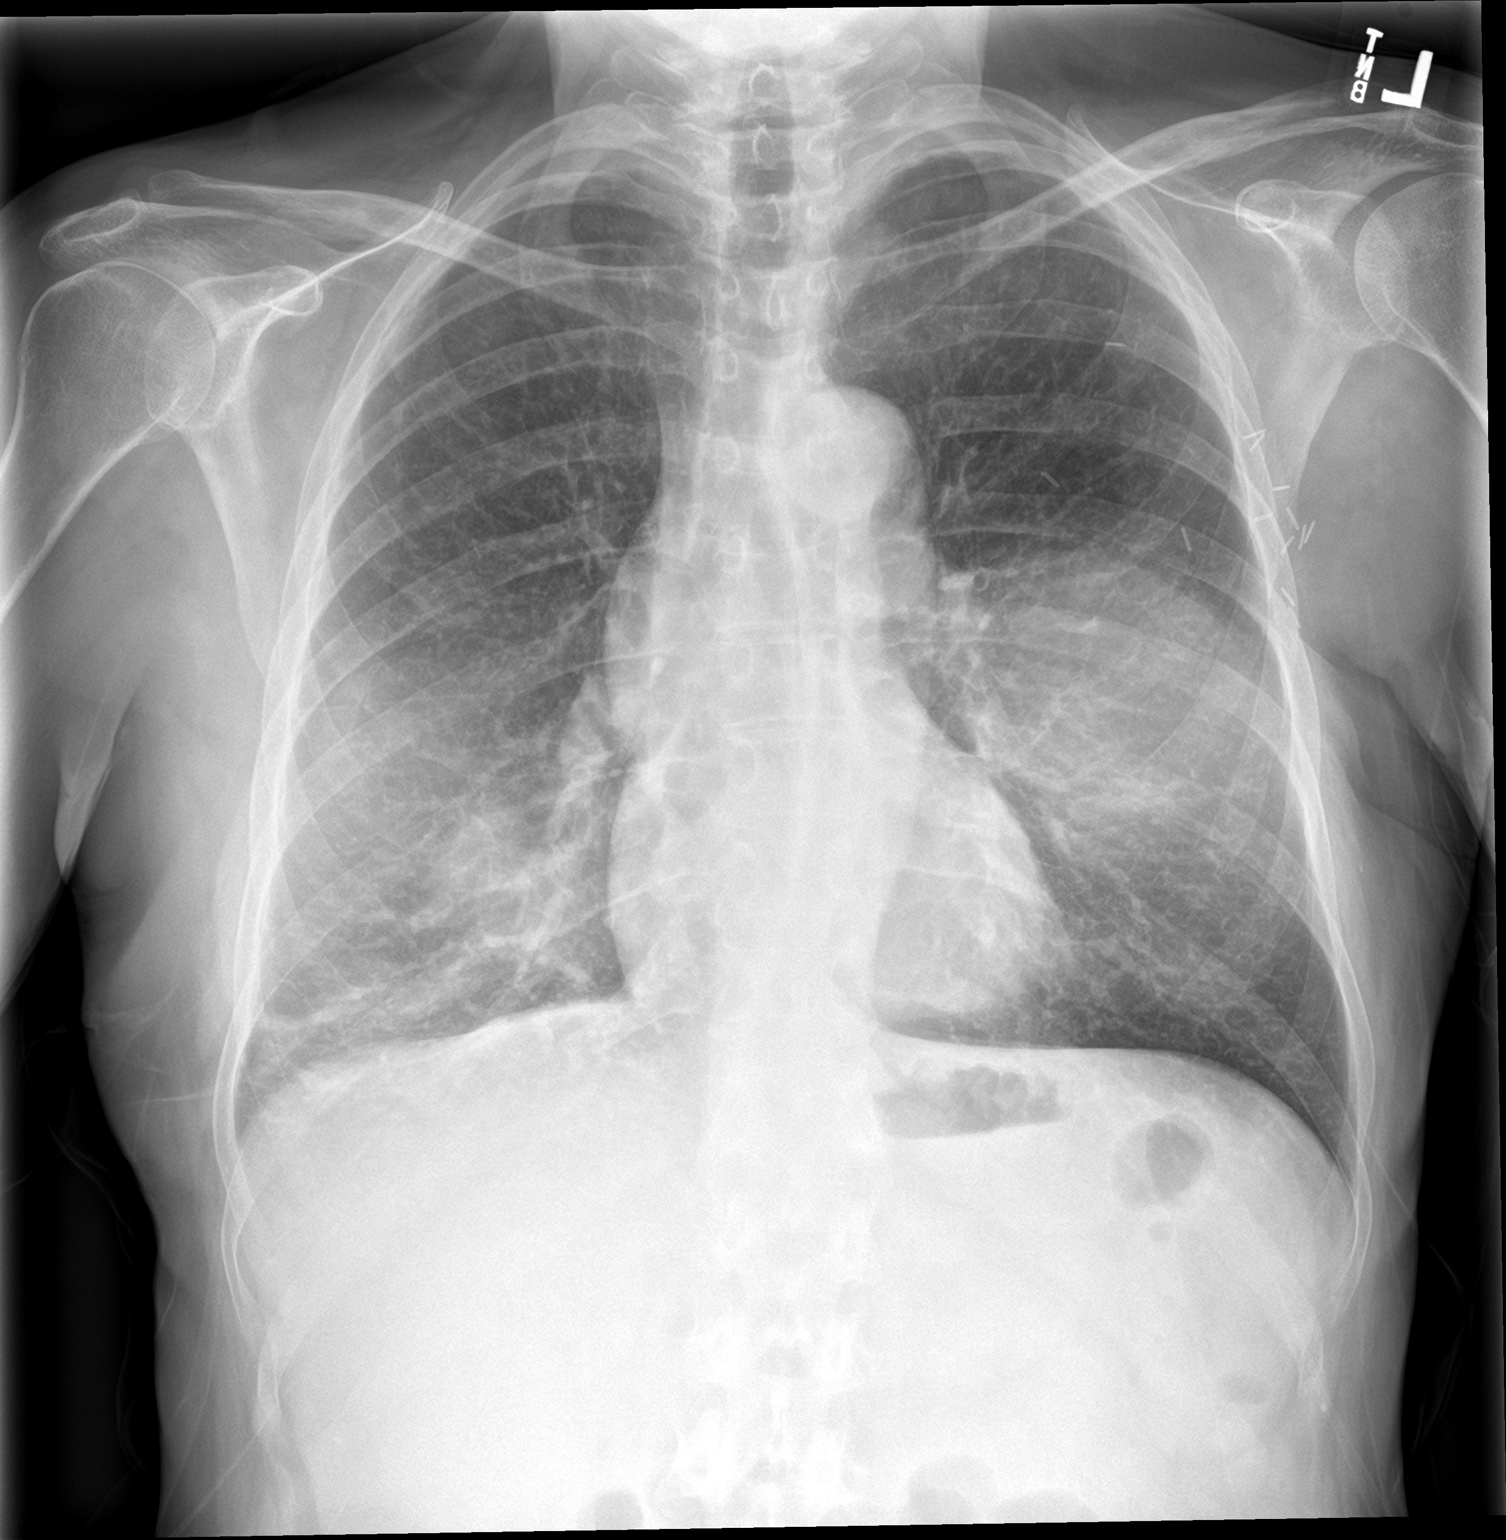

[chest lat]
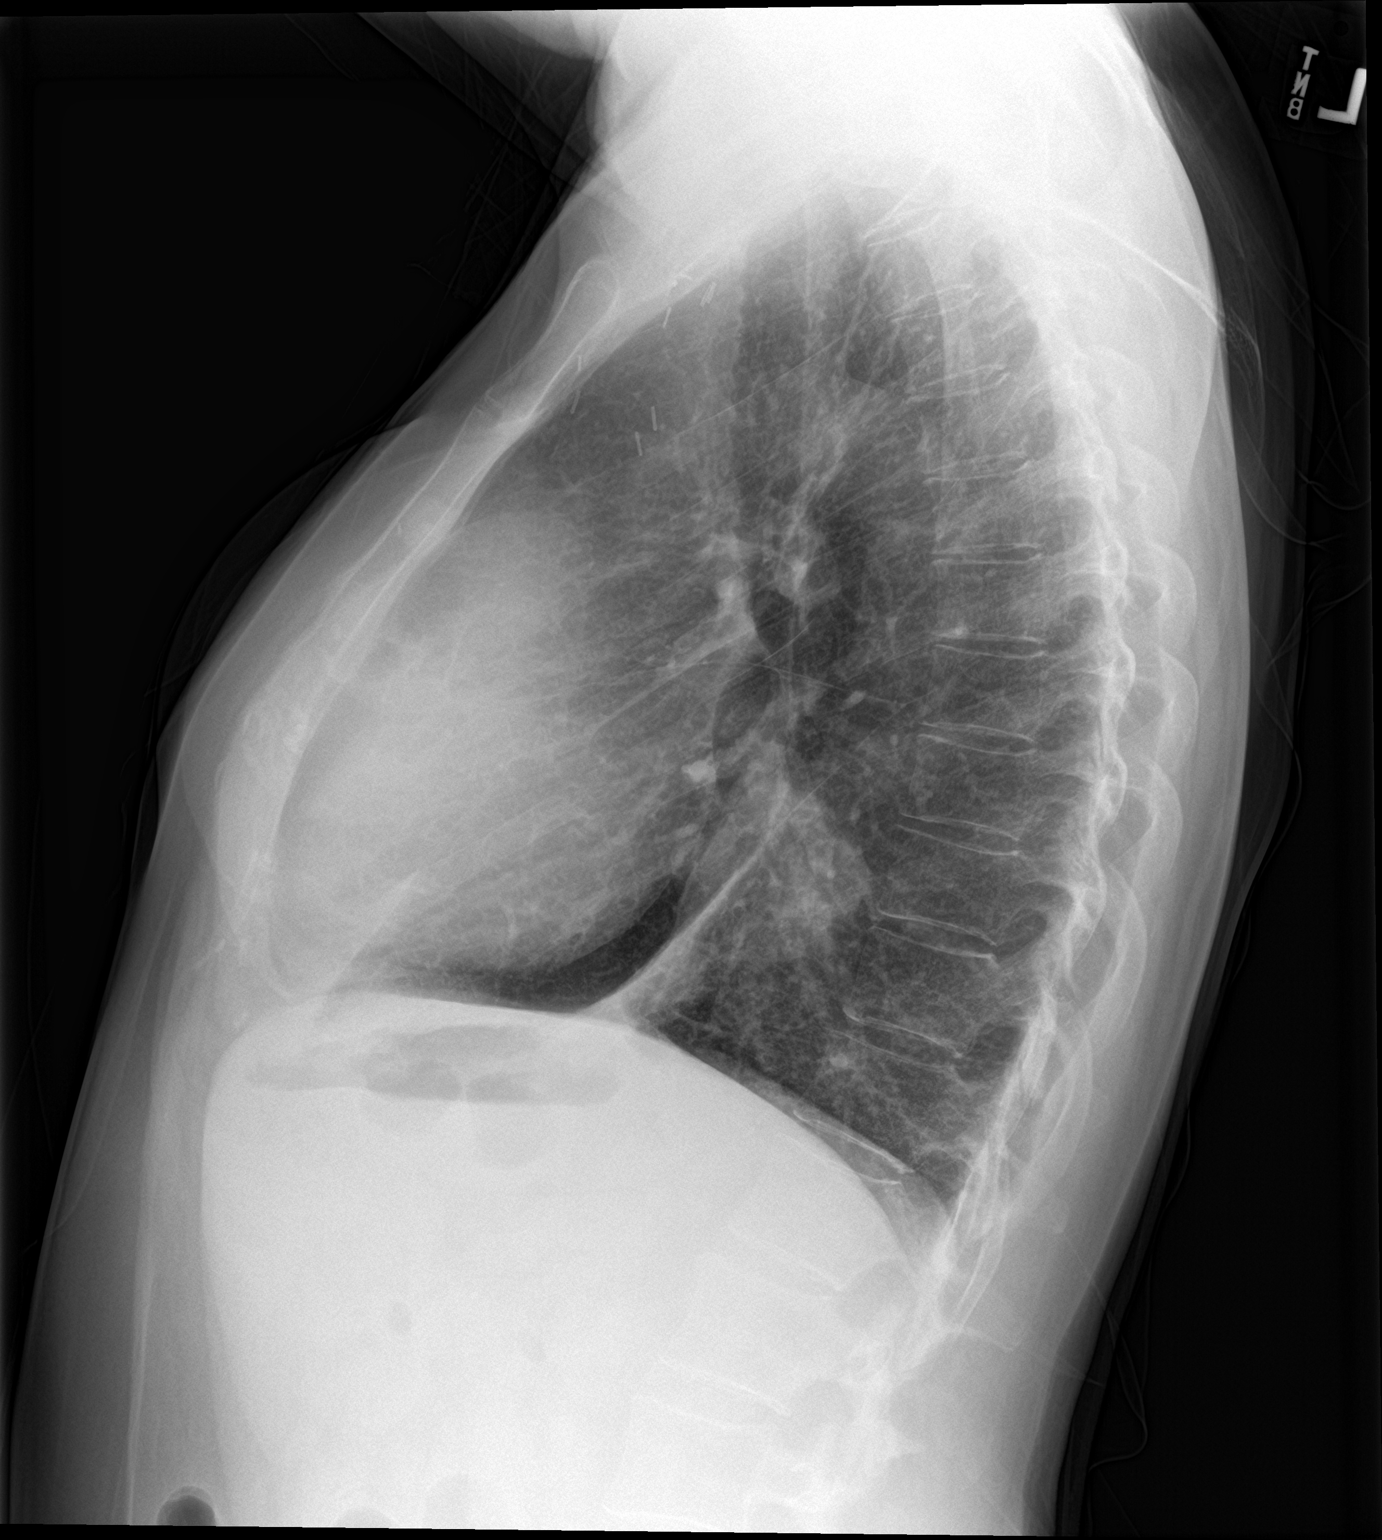

[2 of 2 positions shown; findings below may reference images not displayed]

FINDINGS: An area of increased density in the right lower lobe may represent
atelectasis or infiltrate. The left lung is clear. There is no
pleural effusion or pneumothorax. The cardiac silhouette is within
normal limits. Bilateral breast implants. No acute osseous
pathology. Surgical clips in the left chest wall.
IMPRESSION: Right lower lobe density may represent atelectasis versus
infiltrate.

## 2019-09-04 IMAGING — CT CT HEAD W/O CM
4 series · 16 of 47 positions shown, 18 images · non-contrast
Comparison: None.

CLINICAL DATA: Patient fell backwards on porch landing on back and
hitting back of head after drinking alcohol.

EXAM:
CT HEAD WITHOUT CONTRAST
TECHNIQUE: Contiguous axial images were obtained from the base of the skull
through the vertex without intravenous contrast.

[Series 2: head trauma wo · axial · 0.41mm/px · z∈[+78,+183]mm · 7 of 29 slices shown, 9 images]
[im 4/29  brain]
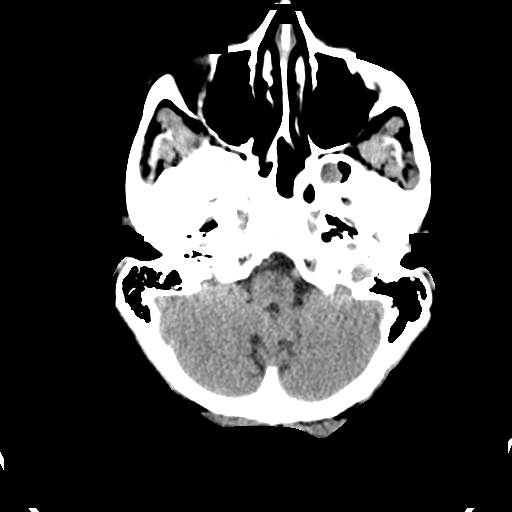
[im 4/29  bone]
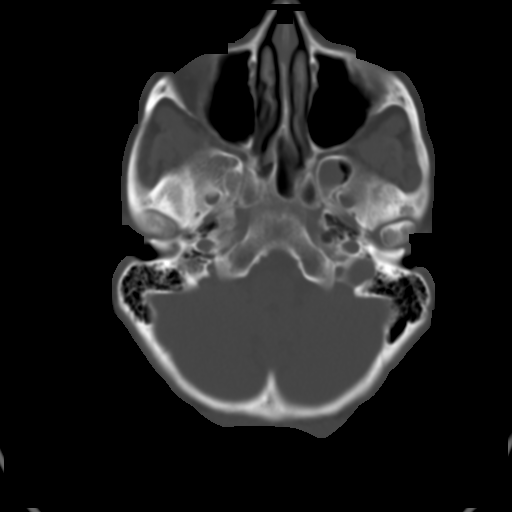
[im 8/29  brain]
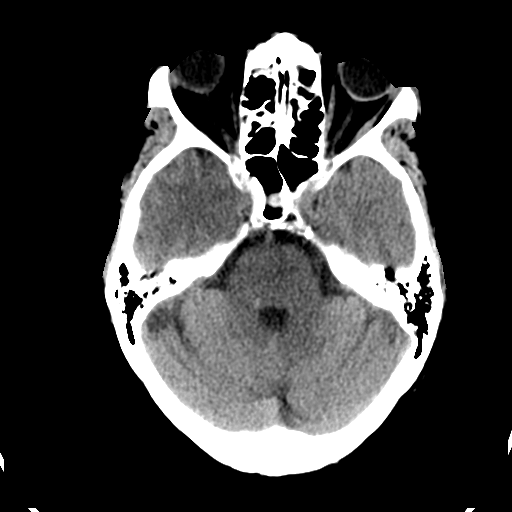
[im 11/29  brain]
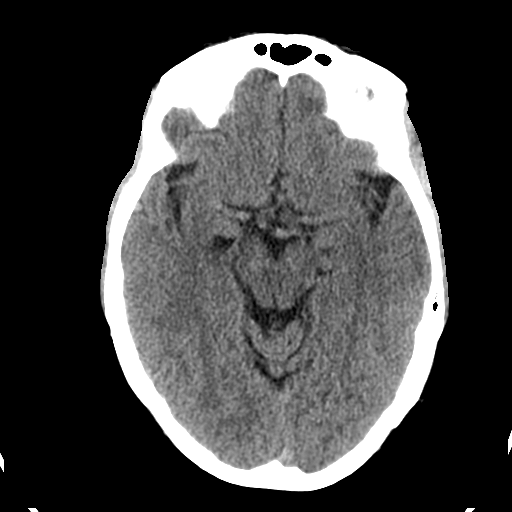
[im 15/29  brain]
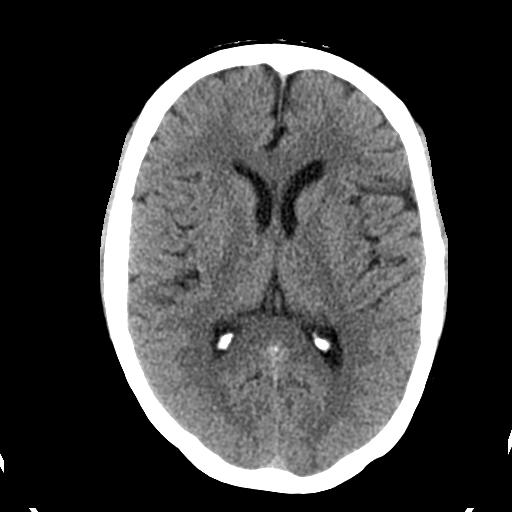
[im 18/29  brain]
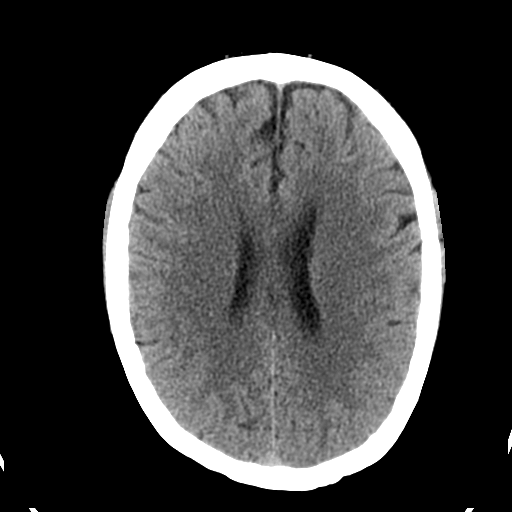
[im 18/29  bone]
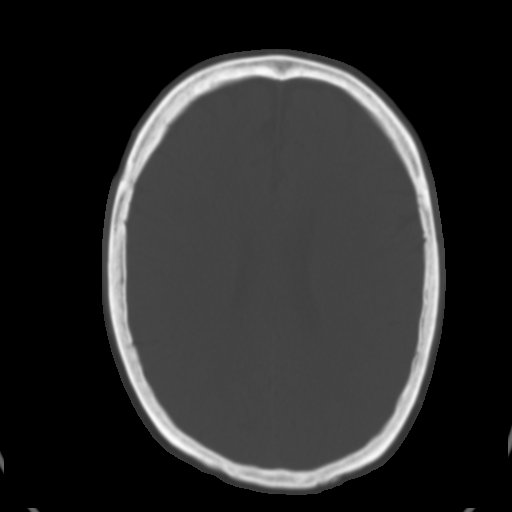
[im 22/29  brain]
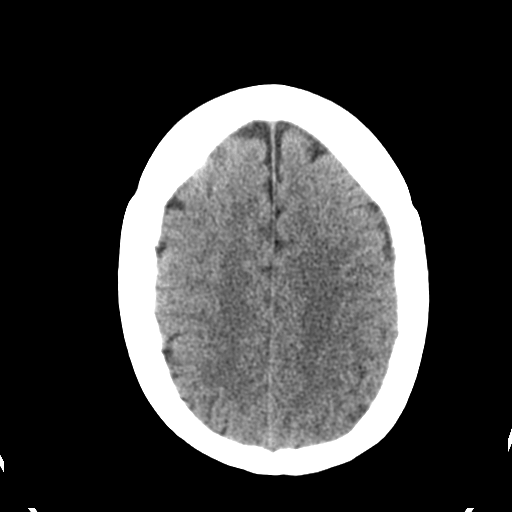
[im 25/29  brain]
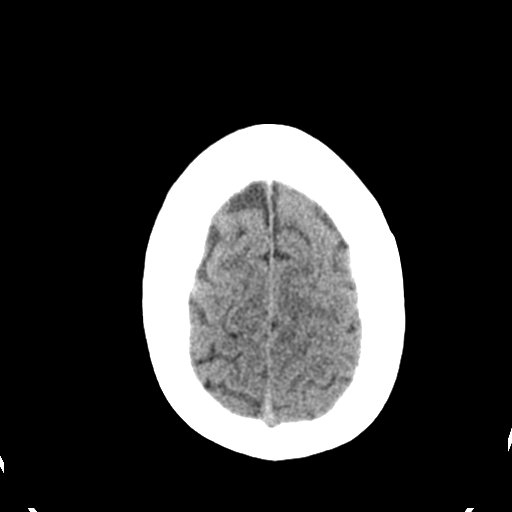

[Series 3: head bone · axial · 0.41mm/px · z∈[+77,+105]mm · 3 of 73 slices shown]
[im 8/73  bone]
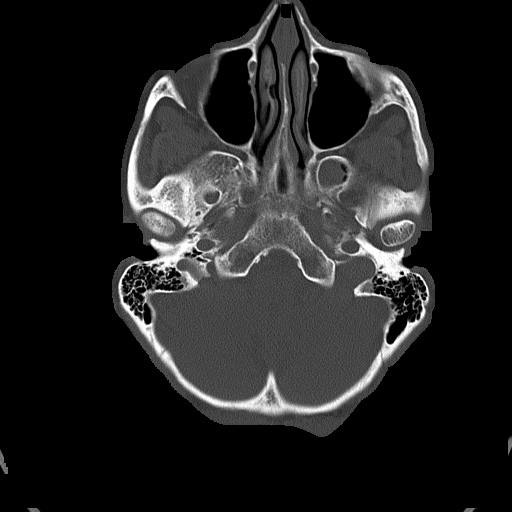
[im 15/73  bone]
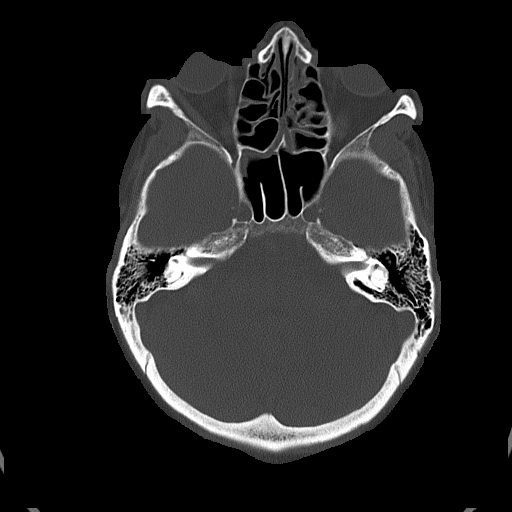
[im 22/73  bone]
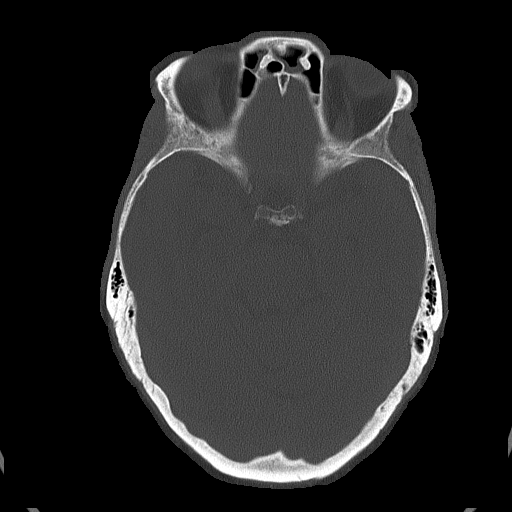

[Series 4: coronal soft tissue · coronal · 0.31mm/px · 3 of 66 slices shown]
[im 22/66  brain]
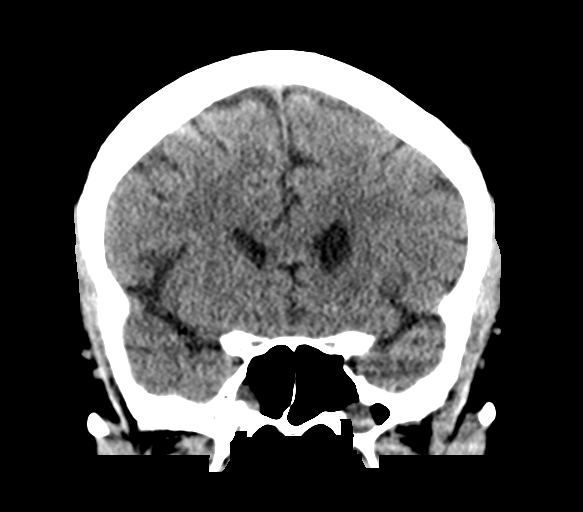
[im 29/66  brain]
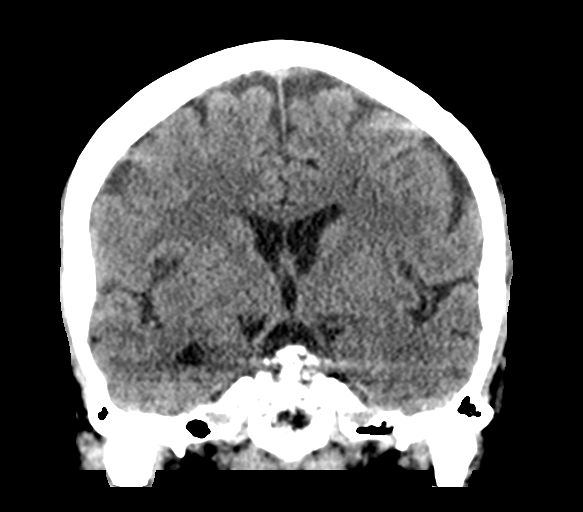
[im 37/66  brain]
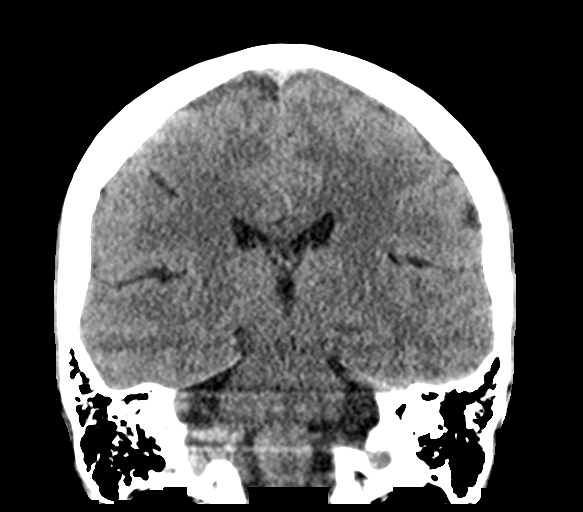

[Series 5: sagittal soft tissue · sagittal · 0.33mm/px · 3 of 52 slices shown]
[im 18/52  brain]
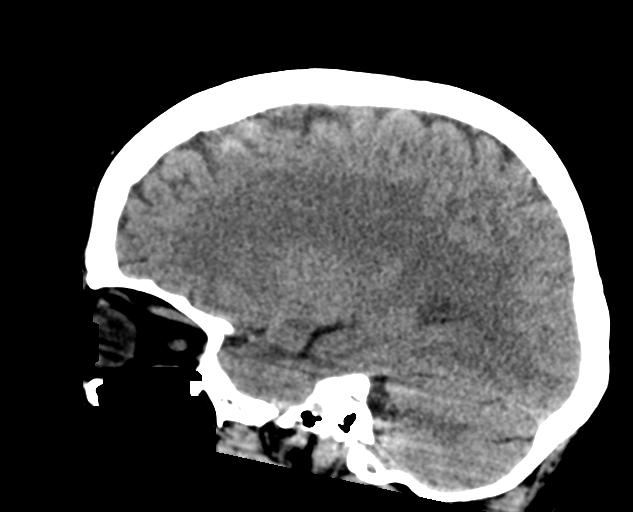
[im 26/52  brain]
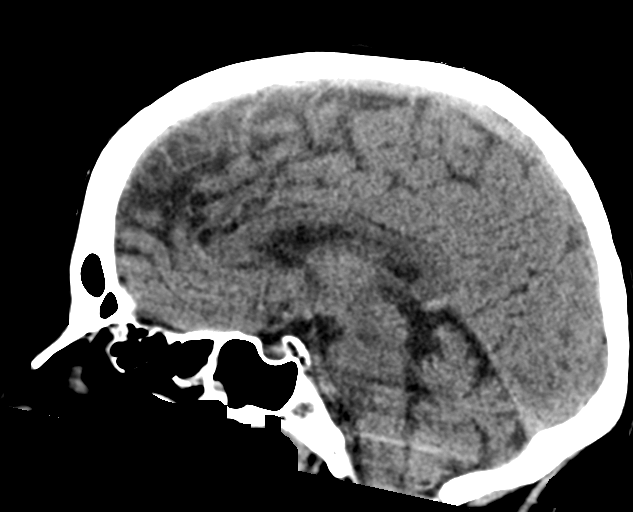
[im 35/52  brain]
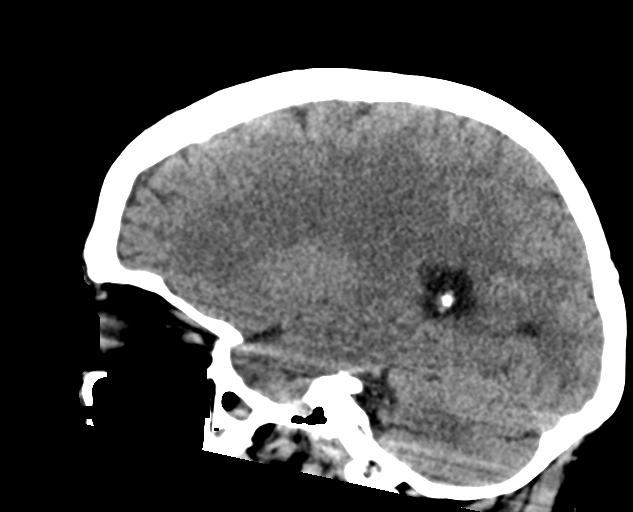

[16 of 47 positions shown; findings below may reference images not displayed]

FINDINGS: Brain: No evidence of acute infarction, hemorrhage, hydrocephalus,
extra-axial collection or mass lesion/mass effect.

Vascular: No hyperdense vessel or unexpected calcification.

Skull: No acute skull fracture.

Sinuses/Orbits: Mild-to-moderate ethmoid and sphenoid sinus mucosal
thickening without air-fluid levels. Intact orbits and globes. The
included maxillary and frontal sinuses are nonacute.

Other: No significant calvarial soft tissue swelling.
IMPRESSION: No acute intracranial abnormality or skull fracture.
